# Patient Record
Sex: Male | Born: 1935 | Race: White | Hispanic: No | Marital: Married | Smoking: Current every day smoker
Health system: Southern US, Community
[De-identification: ages and names within clinical notes are randomized; demographics above are authoritative.]

## PROBLEM LIST (undated history)

## (undated) DIAGNOSIS — G629 Polyneuropathy, unspecified: Secondary | ICD-10-CM

## (undated) DIAGNOSIS — C61 Malignant neoplasm of prostate: Secondary | ICD-10-CM

## (undated) DIAGNOSIS — J449 Chronic obstructive pulmonary disease, unspecified: Secondary | ICD-10-CM

## (undated) DIAGNOSIS — F17200 Nicotine dependence, unspecified, uncomplicated: Secondary | ICD-10-CM

## (undated) DIAGNOSIS — T8859XA Other complications of anesthesia, initial encounter: Secondary | ICD-10-CM

## (undated) DIAGNOSIS — E785 Hyperlipidemia, unspecified: Secondary | ICD-10-CM

## (undated) DIAGNOSIS — M1990 Unspecified osteoarthritis, unspecified site: Secondary | ICD-10-CM

## (undated) DIAGNOSIS — I491 Atrial premature depolarization: Secondary | ICD-10-CM

## (undated) DIAGNOSIS — I493 Ventricular premature depolarization: Secondary | ICD-10-CM

## (undated) DIAGNOSIS — C229 Malignant neoplasm of liver, not specified as primary or secondary: Secondary | ICD-10-CM

## (undated) DIAGNOSIS — T4145XA Adverse effect of unspecified anesthetic, initial encounter: Secondary | ICD-10-CM

## (undated) DIAGNOSIS — I10 Essential (primary) hypertension: Secondary | ICD-10-CM

## (undated) HISTORY — PX: TOTAL SHOULDER REPLACEMENT: SUR1217

## (undated) HISTORY — DX: Unspecified osteoarthritis, unspecified site: M19.90

## (undated) HISTORY — PX: REPLACEMENT TOTAL KNEE: SUR1224

## (undated) HISTORY — DX: Ventricular premature depolarization: I49.3

## (undated) HISTORY — DX: Nicotine dependence, unspecified, uncomplicated: F17.200

## (undated) HISTORY — DX: Hyperlipidemia, unspecified: E78.5

## (undated) HISTORY — PX: CHOLECYSTECTOMY: SHX55

## (undated) HISTORY — DX: Atrial premature depolarization: I49.1

## (undated) HISTORY — DX: Polyneuropathy, unspecified: G62.9

## (undated) HISTORY — DX: Essential (primary) hypertension: I10

## (undated) HISTORY — DX: Malignant neoplasm of prostate: C61

---

## 2002-02-15 ENCOUNTER — Ambulatory Visit (HOSPITAL_COMMUNITY): Admission: RE | Admit: 2002-02-15 | Discharge: 2002-02-15 | Payer: Self-pay | Admitting: Gastroenterology

## 2002-02-15 ENCOUNTER — Encounter (INDEPENDENT_AMBULATORY_CARE_PROVIDER_SITE_OTHER): Payer: Self-pay | Admitting: *Deleted

## 2003-05-11 ENCOUNTER — Encounter: Admission: RE | Admit: 2003-05-11 | Discharge: 2003-05-11 | Payer: Self-pay | Admitting: Family Medicine

## 2005-07-11 ENCOUNTER — Ambulatory Visit: Payer: Self-pay | Admitting: Internal Medicine

## 2005-07-15 ENCOUNTER — Ambulatory Visit: Payer: Self-pay | Admitting: Internal Medicine

## 2005-07-26 ENCOUNTER — Ambulatory Visit (HOSPITAL_COMMUNITY): Admission: RE | Admit: 2005-07-26 | Discharge: 2005-07-26 | Payer: Self-pay | Admitting: Internal Medicine

## 2005-07-31 ENCOUNTER — Ambulatory Visit: Payer: Self-pay | Admitting: Internal Medicine

## 2005-08-22 ENCOUNTER — Ambulatory Visit: Payer: Self-pay | Admitting: Internal Medicine

## 2005-08-27 ENCOUNTER — Ambulatory Visit (HOSPITAL_COMMUNITY): Admission: RE | Admit: 2005-08-27 | Discharge: 2005-08-27 | Payer: Self-pay | Admitting: Gastroenterology

## 2005-08-27 ENCOUNTER — Encounter (INDEPENDENT_AMBULATORY_CARE_PROVIDER_SITE_OTHER): Payer: Self-pay | Admitting: Specialist

## 2005-09-20 ENCOUNTER — Encounter (INDEPENDENT_AMBULATORY_CARE_PROVIDER_SITE_OTHER): Payer: Self-pay | Admitting: Specialist

## 2005-09-20 ENCOUNTER — Ambulatory Visit (HOSPITAL_COMMUNITY): Admission: RE | Admit: 2005-09-20 | Discharge: 2005-09-20 | Payer: Self-pay | Admitting: General Surgery

## 2005-10-14 ENCOUNTER — Ambulatory Visit: Admission: RE | Admit: 2005-10-14 | Discharge: 2005-10-14 | Payer: Self-pay | Admitting: Orthopaedic Surgery

## 2005-10-15 ENCOUNTER — Emergency Department (HOSPITAL_COMMUNITY): Admission: EM | Admit: 2005-10-15 | Discharge: 2005-10-15 | Payer: Self-pay | Admitting: Emergency Medicine

## 2005-10-25 ENCOUNTER — Emergency Department (HOSPITAL_COMMUNITY): Admission: EM | Admit: 2005-10-25 | Discharge: 2005-10-25 | Payer: Self-pay | Admitting: *Deleted

## 2005-11-07 ENCOUNTER — Inpatient Hospital Stay (HOSPITAL_COMMUNITY): Admission: RE | Admit: 2005-11-07 | Discharge: 2005-11-09 | Payer: Self-pay | Admitting: Orthopaedic Surgery

## 2007-02-02 ENCOUNTER — Encounter: Admission: RE | Admit: 2007-02-02 | Discharge: 2007-02-02 | Payer: Self-pay | Admitting: Orthopaedic Surgery

## 2007-03-27 HISTORY — PX: CARDIOVASCULAR STRESS TEST: SHX262

## 2007-07-07 ENCOUNTER — Ambulatory Visit (HOSPITAL_COMMUNITY): Admission: RE | Admit: 2007-07-07 | Discharge: 2007-07-07 | Payer: Self-pay | Admitting: Orthopaedic Surgery

## 2007-11-30 ENCOUNTER — Encounter: Admission: RE | Admit: 2007-11-30 | Discharge: 2007-11-30 | Payer: Self-pay | Admitting: Orthopedic Surgery

## 2008-05-31 ENCOUNTER — Inpatient Hospital Stay (HOSPITAL_COMMUNITY): Admission: EM | Admit: 2008-05-31 | Discharge: 2008-06-03 | Payer: Self-pay | Admitting: Emergency Medicine

## 2008-05-31 ENCOUNTER — Encounter (INDEPENDENT_AMBULATORY_CARE_PROVIDER_SITE_OTHER): Payer: Self-pay | Admitting: Emergency Medicine

## 2008-05-31 ENCOUNTER — Ambulatory Visit: Payer: Self-pay | Admitting: Vascular Surgery

## 2008-06-01 ENCOUNTER — Encounter (INDEPENDENT_AMBULATORY_CARE_PROVIDER_SITE_OTHER): Payer: Self-pay | Admitting: Internal Medicine

## 2008-08-24 ENCOUNTER — Encounter: Admission: RE | Admit: 2008-08-24 | Discharge: 2008-08-24 | Payer: Self-pay | Admitting: Neurology

## 2008-12-23 ENCOUNTER — Ambulatory Visit: Admission: RE | Admit: 2008-12-23 | Discharge: 2009-02-24 | Payer: Self-pay | Admitting: Radiation Oncology

## 2009-02-26 ENCOUNTER — Ambulatory Visit: Admission: RE | Admit: 2009-02-26 | Discharge: 2009-04-19 | Payer: Self-pay | Admitting: Radiation Oncology

## 2009-10-23 ENCOUNTER — Ambulatory Visit: Payer: Self-pay | Admitting: Cardiology

## 2010-03-18 ENCOUNTER — Encounter: Payer: Self-pay | Admitting: Orthopaedic Surgery

## 2010-06-06 LAB — BASIC METABOLIC PANEL
BUN: 11 mg/dL (ref 6–23)
BUN: 14 mg/dL (ref 6–23)
CO2: 27 mEq/L (ref 19–32)
Chloride: 101 mEq/L (ref 96–112)
Chloride: 105 mEq/L (ref 96–112)
Glucose, Bld: 143 mg/dL — ABNORMAL HIGH (ref 70–99)
Potassium: 3.6 mEq/L (ref 3.5–5.1)
Potassium: 3.7 mEq/L (ref 3.5–5.1)
Sodium: 134 mEq/L — ABNORMAL LOW (ref 135–145)
Sodium: 138 mEq/L (ref 135–145)

## 2010-06-06 LAB — URINALYSIS, ROUTINE W REFLEX MICROSCOPIC
Glucose, UA: NEGATIVE mg/dL
Ketones, ur: NEGATIVE mg/dL
Nitrite: NEGATIVE
Protein, ur: NEGATIVE mg/dL
Urobilinogen, UA: 0.2 mg/dL (ref 0.0–1.0)

## 2010-06-06 LAB — CBC
HCT: 30.4 % — ABNORMAL LOW (ref 39.0–52.0)
HCT: 30.4 % — ABNORMAL LOW (ref 39.0–52.0)
HCT: 31.5 % — ABNORMAL LOW (ref 39.0–52.0)
Hemoglobin: 10.4 g/dL — ABNORMAL LOW (ref 13.0–17.0)
Hemoglobin: 10.7 g/dL — ABNORMAL LOW (ref 13.0–17.0)
MCV: 92.2 fL (ref 78.0–100.0)
MCV: 93.4 fL (ref 78.0–100.0)
Platelets: 299 10*3/uL (ref 150–400)
Platelets: 344 10*3/uL (ref 150–400)
Platelets: 376 10*3/uL (ref 150–400)
RBC: 3.28 MIL/uL — ABNORMAL LOW (ref 4.22–5.81)
RDW: 14.9 % (ref 11.5–15.5)
RDW: 15.2 % (ref 11.5–15.5)
WBC: 7.8 10*3/uL (ref 4.0–10.5)
WBC: 9.5 10*3/uL (ref 4.0–10.5)

## 2010-06-06 LAB — DIFFERENTIAL
Basophils Absolute: 0.1 10*3/uL (ref 0.0–0.1)
Basophils Relative: 1 % (ref 0–1)
Eosinophils Relative: 8 % — ABNORMAL HIGH (ref 0–5)
Monocytes Absolute: 0.9 10*3/uL (ref 0.1–1.0)
Monocytes Relative: 11 % (ref 3–12)
Neutrophils Relative %: 62 % (ref 43–77)

## 2010-06-06 LAB — IRON AND TIBC
Iron: 77 ug/dL (ref 42–135)
Saturation Ratios: 26 % (ref 20–55)
TIBC: 299 ug/dL (ref 215–435)
UIBC: 222 ug/dL

## 2010-06-06 LAB — VITAMIN B12: Vitamin B-12: 223 pg/mL (ref 211–911)

## 2010-06-06 LAB — CARDIAC PANEL(CRET KIN+CKTOT+MB+TROPI)
CK, MB: 2.7 ng/mL (ref 0.3–4.0)
Relative Index: 1.3 (ref 0.0–2.5)
Total CK: 170 U/L (ref 7–232)
Total CK: 179 U/L (ref 7–232)
Troponin I: 0.01 ng/mL (ref 0.00–0.06)
Troponin I: <0.01 ng/mL (ref 0.00–0.06)
Troponin I: <0.01 ng/mL (ref 0.00–0.06)

## 2010-06-06 LAB — TSH: TSH: 1.879 u[IU]/mL (ref 0.350–4.500)

## 2010-06-06 LAB — APTT: aPTT: 34 seconds (ref 24–37)

## 2010-06-06 LAB — GLUCOSE, CAPILLARY
Glucose-Capillary: 111 mg/dL — ABNORMAL HIGH (ref 70–99)
Glucose-Capillary: 115 mg/dL — ABNORMAL HIGH (ref 70–99)
Glucose-Capillary: 124 mg/dL — ABNORMAL HIGH (ref 70–99)

## 2010-06-06 LAB — POCT CARDIAC MARKERS
CKMB, poc: 2 ng/mL (ref 1.0–8.0)
Troponin i, poc: <0.05 ng/mL (ref 0.00–0.09)

## 2010-06-06 LAB — COMPREHENSIVE METABOLIC PANEL
AST: 36 U/L (ref 0–37)
Albumin: 3 g/dL — ABNORMAL LOW (ref 3.5–5.2)
Alkaline Phosphatase: 66 U/L (ref 39–117)
BUN: 14 mg/dL (ref 6–23)
CO2: 27 mEq/L (ref 19–32)
Chloride: 106 mEq/L (ref 96–112)
GFR calc Af Amer: >60 mL/min (ref >60)
Potassium: 4.3 mEq/L (ref 3.5–5.1)
Sodium: 139 mEq/L (ref 135–145)
Total Bilirubin: 0.9 mg/dL (ref 0.3–1.2)
Total Protein: 5.9 g/dL — ABNORMAL LOW (ref 6.0–8.3)

## 2010-07-10 NOTE — Consult Note (Signed)
NAME:  Lonnie, Barron NO.:  [id number]   MEDICAL RECORD NO.:  [id number]          PATIENT TYPE:  INP   LOCATION:  1430                         FACILITY:  [facility]   PHYSICIAN:  Ollen Gross, M.D.    DATE OF BIRTH:  October 12, 1928   DATE OF CONSULTATION:  DATE OF DISCHARGE:                                 CONSULTATION   REFERRING PHYSICIAN:  Isidor Holts, M.D., [facility] hospitalist.   REASON FOR CONSULTATION:  Evaluate left knee wound.   HISTORY OF PRESENT ILLNESS:  Lonnie Barron is a 77 year old male who  underwent a left total knee arthroplasty by Dr. Margie Billet at [facility] a week ago.  He had a stable postoperative course initially.  On  April 6, however when physical therapy came to visit him at home, it  was noted that he became dysarthric.  There was concerns about a stroke  and thus he was taken urgently to [facility] for evaluation.  He was admitted to the [facility].  He had been  evaluated for altered mental status.  Evaluation included a Doppler scan  of the lower extremity to rule out DVT which was negative.  He had a CT  angio which was negative.  MRI of his brain which showed no acute  strokes.  We were consulted to evaluate his knee wound.  The wound care  team saw him first on April 7, and did not see any signs of infection.  We are seeing him today for evaluation of the knee replacement.   PHYSICAL EXAMINATION:  A well-developed male in no apparent distress.  He has been afebrile with stable vital signs.  His most recent  temperature is 97.6 degrees Fahrenheit.  Evaluation of the left lower extremity shows moderate swelling, but  within normal limits for 1 week postoperatively from a knee replacement.  He does not have any calf tenderness.  Denna Haggard' sign is negative.  The  knee incision is well-healed with no erythema or exudate.  He has mild  swelling in the knee, but again normal for 1 week postop.  The thigh  is  also swollen, but within normal limits for a week postop.  There are no  signs of infection in the left lower extremity.   His most recent CBC shows a hemoglobin of 10.7 and a white count of 9.5,  thus no systemic signs of infection either.   ASSESSMENT:  Left total knee.  The left lower extremity has a normal  appearance for 1 week postoperative.  There are absolutely no signs of  infection.  I feel that it will be safe for him to resume physical  therapy for weightbearing as tolerated on left lower extremity and for  range of motion of the left knee whenever medicine feels that he is  medically stable to do so.  He can continue on a routine postoperative  course.      Ollen Gross, M.D.  Electronically Signed    FA/MEDQ  D:  06/02/2008  T:  06/02/2008  Job:  [id number]   cc:   Isidor Holts, M.D.

## 2010-07-10 NOTE — Procedures (Signed)
EEG NUMBER:  [id number]   REFERRING PHYSICIAN:  Isidor Holts, M.D.   CLINICAL HISTORY:  A 77 year old patient being evaluated for episodes of  syncope.   MEDICATION LISTED:  Heparin, Protonix, Flomax, Norvasc, Lotensin,  Neurontin, Zofran, Tylenol, Dilaudid, Klonopin, Haldol,  hydrochlorothiazide, Ativan, and oxycodone.   This is a 17-channel portable EEG recorded with the patient awake and  restless.  Standard 10/20 electrode placement was performed.   The background rhythm is probably 9-10 Hz alpha.  The patient is  referred for the tracing, there is excessive snoring artifact and  movement artifacts noted throughout.  The patient appears to be in light  sleep throughout the recording.  No definite paroxysmal epileptiform  features were noted.  Length of the recording is 20.9 minutes.  Technical component is average with excessive movement artifacts and  respiratory artifacts.  EKG tracing shows regular sinus rhythm.  Hyperventilation and photic stimulation were not performed.  This being  a portable study.   IMPRESSION:  This EEG performed during sleep states mainly is probably  within normal limits though suboptimal due to excessive respiratory and  movement artifacts.           ______________________________  Sunny Schlein. Pearlean Brownie, MD     ZOX:WRUE  D:  06/01/2008 19:54:34  T:  06/02/2008 05:01:43  Job #:  [id number]   cc:   Isidor Holts, M.D.

## 2010-07-10 NOTE — Op Note (Signed)
NAME:  Lonnie Barron, Lonnie Barron NO.:  [id number]   MEDICAL RECORD NO.:  [id number]          PATIENT TYPE:  AMB   LOCATION:  SDS                          FACILITY:  [facility]   PHYSICIAN:  Lubertha Basque. Dalldorf, M.D.DATE OF BIRTH:  February 17, 1929   DATE OF PROCEDURE:  07/07/2007  DATE OF DISCHARGE:  07/07/2007                               OPERATIVE REPORT   PREOPERATIVE DIAGNOSIS:  Right shoulder stiffness.   POSTOPERATIVE DIAGNOSIS:  Right shoulder stiffness.   PROCEDURES:  1. Right shoulder closed manipulation.  2. Right shoulder arthroscopic debridement.   ANESTHESIA:  General.   ATTENDING SURGEON:  Lubertha Basque. 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  He is  about 2 years from hemiarthroplasty of the shoulder.  He is also about a  year from arthroscopic debridement and manipulation done at [facility].  He  feels that afforded him transient relief.  It is felt that his best  procedure at this point will be conversion to total shoulder replacement  with the glenoid component but that is not anything that he is willing  to consider at this point.  He wished to undergo a repeat arthroscopy  and hopes that this helps him at least short term.  I reviewed the risk  of anesthesia, infection, and fracture related to manipulation and  repeat arthroscopy.  Informed operative consent was obtained.   SUMMARY OF FINDINGS AND PROCEDURE:  Under general anesthesia, a closed  manipulation was performed improving his forward flexion from about 80-  140 with audible pops on the way.  I did not push his external rotation  which was already 30 degrees and greater than his preoperative motion  prior to the shoulder replacement.  He was prepped and draped in normal  sterile fashion and then an arthroscopy was performed.  He had some scar  tissue which we debrided  and grade 4 change on portions of the glenoid  which was debrided.  The subscapularis appeared to be intact on the  anterior aspect of the shoulder and the rotator cuff also appeared  intact from below.   DESCRIPTION OF PROCEDURE:  The patient was taken to the operating suite  where general anesthetic was applied without difficulty.  He was  positioned in beach-chair position and prepped and draped in normal  sterile fashion.  After administration of IV Kefzol, an arthroscopy of  the right shoulder was performed.  This followed the manipulation where  we improved this forward flexion from 81-40 at least with audible pops.  In the shoulder and the glenohumeral joint,  he had some blood which we  evacuated consistent with his manipulation.  There were bands of scar  tissue which I excised.  Inspection of the glenoid revealed some  maintenance of this but really broad areas of grade 4 change at this  point.  The rotator cuff including the subscapularis appeared to be  intact.  The axillary recess was also addressed with removal of some  scar tissue.  The shoulder was thoroughly irrigated followed by  placement of Marcaine with epinephrine and morphine along with 80 mg of  Depo-Medrol.  Simple sutures of nylon were used to loosely reapproximate  the portals followed by Adaptic and dry gauze dressing with tape.  Estimated blood loss and fluids obtained from anesthesia records.   DISPOSITION:  The patient was extubated in the operating room and taken  to recovery room in stable addition.  He is to go home the same-day and  followup in the office next week.  I will contact him by phone tonight.      Lubertha Basque Jerl Santos, M.D.  Electronically Signed     PGD/MEDQ  D:  07/07/2007  T:  07/08/2007  Job:  [id number]

## 2010-07-10 NOTE — H&P (Signed)
NAME:  Lonnie Barron, Lonnie Barron NO.:  [id number]   MEDICAL RECORD NO.:  [id number]          PATIENT TYPE:  EMS   LOCATION:  ED                           FACILITY:  [facility]   PHYSICIAN:  Lucita Ferrara, MD         DATE OF BIRTH:  1928/08/13   DATE OF ADMISSION:  05/31/2008  DATE OF DISCHARGE:                              HISTORY & PHYSICAL   PRIMARY CARE PHYSICIAN:  [facility].   CHIEF COMPLAINT:  Alteration in mental status.   HISTORY OF PRESENT ILLNESS:  The patient is 77 year old status post  total left knee replacement at [facility] by Dr. Barbara Cower  on May 26, 2008 who presents with alteration in mental status.  Symptoms occurred at 2:25 p.m.;  currently it is 6:00 p.m.  The patient  symptoms included being witnessed to slump over with slurred speech for  a brief period of time,  nearly 4 hours after he had taken Dilaudid.  The family by bedside giving most of the history.  He has been up and  walking around, not sleeping except for short periods of time at night.  He took his pain medications; his wife by bedside showing bottles to me  7:30 a.m. this morning.  At 11:40 a.m.Marland Kitchen, he was walking across the  kitchen and had to sit down and was not acting right..  He had slurred  speech.  He had glassy eyes.  He was pale and sweating on his  scalp.  He denied any chest pain or shortness of breath. There was no  shaking that was noted.  There was no foaming of the mouth.  There was  no urinary or stool incontinence.  There is no fevers or chills.   Otherwise 12 point review of systems was negative.   PAST MEDICAL HISTORY IS:  Significant for:  1. Hypertension.  2. Diabetes type 2.  3. Nephrolithiasis.  4. Neuropathy.  5. Osteoarthritis.  6. Diabetes type 2.   FAMILY HISTORY:  Noncontributory.   ALLERGIES:  CODEINE AND CIPRO.   MEDICATIONS:  1. __________hydrochlorothiazide.  2. Aspirin.  3. Metformin.  4. Metoprolol.  5.  Amlodipine.  6. Amoxicillin.  7. Benazepril.  8. Chlorazepam.  9. Vitamin D.  10.Ferrous sulfate.  11.Gabapentin.  12.Senna.  13.Vitamin E.   NOTE:  Medications are not complete until medication reconciliation  sheet has been filled out.   REVIEW OF SYSTEMS:  As per HPI.  A 12 point otherwise  negative and  reviewed.   PAST SURGICAL HISTORY:  Status post cholecystectomy, status post knee  replacement.   SOCIAL HISTORY:  Patient denies drugs alcohol or tobacco.  Lives with  his spouse.   PHYSICAL EXAMINATION:  GENERAL:  The patient is in no acute distress.  Blood pressure is 128/70, pulse 87, respirations 16, temperature 97.5,  pulse oximetry 98% on room air.  HEENT: Normocephalic, atraumatic.  Sclerae is anicteric.  Neck:  Supple.  CARDIOVASCULAR:  S1 and S2.  Regular rate and rhythm.  No murmurs, rubs  or clicks.  LUNGS:  Clear to auscultation bilaterally without  rales or wheeze.  ABDOMEN:  Soft, nontender, nondistended, positive bowel sounds.  EXTREMITIES:  No clubbing, cyanosis or edema.  Swelling in the left knee  noted.  Dressing on his knee is clean, dry and intact.   EKG shows atrial bigeminy and there is no  prior EKG for comparison.  Cardiac enzymes negative.   Chest x-ray:  No active acute disease.  CT scan of the head:  Negative  for bleed or other intracranial abnormalities, atrophy and nonspecific  white matter changes.   CMP normal.  INR 1.0.  CBC normal.   ASSESSMENT:  The patient is 77-year with presyncopal episode:  1. Presyncopal episode,  alteration in mental status and changes in      cognition.  Rule out TIA versus CVA. Rule out seizure disorder.      Question if secondary to medication-induced.  2. Osteoarthritis status post knee replacement.  3. Hypertension.  4. Diabetes.  5. New bigeminy although no previous EKG to compare to.   DISCUSSION:  The patient will be admitted to medical telemetry unit.  Neurological checks q.4 h.  Will go ahead  and proceed with an MRI, MRA  of the brain and bilateral carotid Dopplers, stroke panel.  We will go  ahead and cycle his cardiac enzymes x3 q.8h.  Cardiology consultation as  needed.  Neurological consultation as needed.  Continue dressing changes  for his status post knee replacement.  The patient is not short of  breath and did not have  any chest pain to rule out any pulmonary reason however will go head and  get a D-dimer and if elevated would proceed with CT angiography.  The  patient is not hypoxic here thus clinical correlation may be useful.  DVT and GI prophylaxis with Lovenox and Protonix.  The rest of his plans  will depend on his progress.      Lucita Ferrara, MD  Electronically Signed     RR/MEDQ  D:  05/31/2008  T:  05/31/2008  Job:  [id number]

## 2010-07-10 NOTE — Discharge Summary (Signed)
NAME:  Lonnie Barron, Lonnie Barron NO.:  [id number]   MEDICAL RECORD NO.:  [id number]          PATIENT TYPE:  INP   LOCATION:  1430                         FACILITY:  [facility]   PHYSICIAN:  Isidor Holts, M.D.  DATE OF BIRTH:  [birth year]   DATE OF ADMISSION:  05/31/2008  DATE OF DISCHARGE:  06/03/2008                               DISCHARGE SUMMARY   PRIMARY MEDICAL DOCTOR:  Dr. Gilmore Laroche, [facility].   PRIMARY ORTHOPEDIC SURGEON:  Dr. Margie Billet, [facility].   DISCHARGE DIAGNOSES:  1. Altered mental status/transient dysarthria, secondary to probable      TIA.  2. Delirium secondary to opiates.  3. Hypertension.  4. Type 2 diabetes mellitus.  5. History of nephrolithiasis.  6. History of diabetic neuropathy.  7. Recent urinary tract infection.  8. Mild anemia.  9. Borderline B12 deficiency.  10.Osteoarthritis, status post left total knee replacement May 26, 2008.   DISCHARGE MEDICATIONS:  1. Flomax 0.4 mg p.o. daily.  2. Hydrochlorothiazide 25 mg p.o. daily.  3. Aspirin 325 mg p.o. daily.  4. Metformin 500 mg p.o. daily.  5. Amlodipine 10 mg p.o. daily.  6. Benazepril 40 mg p.o. daily.  7. Ferrous sulfate 325 mg p.o. daily.  8. Neurontin 300 mg p.o. t.i.d.  9. Senna one p.o. p.r.n. daily.  10.Clonazepam 0.5 mg p.o. p.r.n. b.i.d.  11.Ultram 25 mg p.o. p.r.n. q. 8 a.m.  12.Motrin 400 mg p.o. t.i.d. with food for 5 days only.  13.Vitamin B12 1000 mcg p.o. daily.   Note: Amoxicillin and Dilaudid have been discontinued.   PROCEDURES:  1. Head CT scan dated May 31, 2008.  This was negative for bleed or      other acute intracranial process.  There was atrophy and      nonspecific white matter changes.  2. Chest x-ray dated May 31, 2008.  This showed no acute disease.  3. X-ray left ankle data May 31, 2008.  This showed small calcific      density posterior to the distal tibia, possibly small avulsion  fragment.  4. Brain MRI dated June 01, 2008.  This was a limited examination.  No      acute stroke identified.  Chronic small vessel disease of the      hemispheric white matter.  5. CT angiogram dated June 01, 2008.  This showed no significant, but      small pulmonary embolus, sensitivity for the distal emboli was      limited due to suboptimal contrast bolus and significant patient      breathing motion.  No focal airspace disease or mass lesions, mild      atherosclerotic disease, postoperative changes of right shoulder      replacement.  6. EEG done June 01, 2008.  This was performed during sleep states,      and is probably within normal limits though suboptimal due to      excessive respiratory movement artifacts.  7. Bilateral lower extremity venous Doppler done on May 31, 2008.  This showed no obvious evidence of DVT, superficial thrombosis of      Baker's cyst, lymph nodes noted in the left groin, interstitial      fluid noted throughout.  8. Bilateral carotid/vertebral artery duplex scan dated June 01, 2008.      This showed left internal carotid artery severe plaque in the      vessel, vertebral artery flow antegrade bilaterally.  No      significant right ICA or left ICA stenosis noted.   CONSULTATIONS:  Dr. Ollen Gross, orthopedic surgeon.   ADMISSION HISTORY:  As H&P notes of May 31, 2008, dictated by Dr.  Flonnie Overman.  However, in brief, this is a 77 year old male, with known  history of hypertension, type 2 diabetes mellitus/neuropathy,  nephrolithiasis, osteoarthritis status post left total knee replacement  at [facility] May 26, 2008, presenting with altered mental status  described as slumping associated with slurred speech for a brief  period of time, maybe four hours after taking Dilaudid. Reportedly, at  about 11:40 a.m. after a rather sleepless night, while walking across  the kitchen suddenly had to sit down and was not acting right, had  glassy  eyes and slurred speech.  There were no observed involuntary  movements, frothing at the mouth, tongue-biting, stool or urinary  incontinence.  The patient was brought to the emergency department and  was subsequently admitted for further evaluation, investigation and  management.   CLINICAL COURSE.:  1. Altered mental status/transient dysarthria.  For details of      presentation, refer to admission history above.  Given the sudden      onset of symptomatology and the brevity of dysarthria, it was felt      that the patient may have suffered a TIA or less likely, a CVA.      Possible seizure disorder was considered.  The patient underwent      CVA workup including brain CT scan, MRI, carotid/vertebral artery      duplex scans.  For details of findings, refer to procedure list      above.  This showed no evidence of significant cerebrovascular      stenosis or acute infarct.  The patient was continued on low-dose      Aspirin.  EEG was unremarkable for seizure disorder.  The patient      had no further episodes of seizure, syncope or focal neurology      during the course of his hospitalization.  Bilateral lower      extremity venous Dopplers as well as chest CT angiogram done to      rule out venous thromboembolic disease, were negative.   1. Delirium.  This was likely contributory to altered mental status      above.  Following negative workup, including septic workup, i.e.      negative chest x-ray, negative urinalysis and blood cultures.  The      patient's medication was reviewed.  Dilaudid was discontinued.      Pain management was instituted with NSAIDS as well as low-dose      Ultram, and by June 02, 2008, the patient's mental status was back      to baseline, and has remained so ever since. Opioids may have been      the likely culprit.   1. Hypertension.  This was controlled with pre-admission      Hydrochlorothiazide, ACE inhibitor and calcium channel blocker.      The  patient was normotensive  throughout the course of his      hospitalization.   1. Type 2 diabetes mellitus.  This was controlled with a combination      of oral hypoglycemic, sliding-scale insulin coverage and      carbohydrate modified diet, and the patient remained euglycemic.   1. History of nephrolithiasis.  There were no problems referable to      this.   1. Neuropathy.  This remained controlled on pre-admission doses of      Gabapentin.   1. History of recent UTI.  The patient reportedly had been on a course      of Amoxicillin pre-admission, for possible UTI and had completed a      five day course of this treatment.  Urinalysis was negative for      urinary tract infection and urine cultures were negative.      Amoxicillin was therefore not continued.   1. Mild anemia.  The patient did have a mild anemia with hemoglobin of      10.4 as of June 03, 2008.  Iron studies showed the following      findings.  Iron 77, TIBC 299, percent desaturation 26, ferritin      150.  B12 was borderline low at 223.  The patient is already on      iron supplements.  It is likely that his mild anemia was secondary      to his recent surgery.  However, it was felt appropriate to      commence oral vitamin B12 supplements.  This has been instituted      accordingly.   1. Osteoarthritis, status post left total knee replacement May 26, 2008.  The patient underwent physiotherapy during the course of      this hospitalization.  His wound was reviewed by the wound care      team, and it was felt to be healing well.  Orthopedic consultation      was kindly provided by Dr. Ollen Gross.  For details of that      consultation, refer to consultation notes of June 02, 2008.  He has      opined that this patient's left lower extremity has an appearance      consitent with the current postoperative interval, and there was no      sign of infection.  He recommended continued physical therapy for       weightbearing as tolerated with range of motion of the left knee.      The patient underwent PT/OT during the course of this      hospitalization and was ambulating with a rolling walker and      assistance as of June 02, 2008.   DISPOSITION:  The patient was on June 03, 2008, considered clinically  stable for discharge.  Mental status was at baseline and there were no  new issues.  He was very keen to go, and was discharged accordingly.   DISCHARGE INSTRUCTIONS:  1. Diet heart-healthy/carbohydrate modified.  2. Activity per PT/OT.  3. Wound care, dry dressings only.   FOLLOW-UP INSTRUCTIONS:  The patient is recommended to follow up with  his primary MD, Dr. Gilmore Laroche, [facility], per prior  scheduled appointment.  He is also to follow up with Dr. Margie Billet his  primary orthopedic surgeon at [facility], per prior scheduled  appointment.   SPECIAL INSTRUCTIONS:  Continued PT/OT is recommended.  Isidor Holts, M.D.  Electronically Signed     CO/MEDQ  D:  06/03/2008  T:  06/03/2008  Job:  [id number]   cc:   Orthopedic Surgeon, [facility] Dr. Windell Moment [facility] Dr. Gilmore Laroche

## 2010-07-13 NOTE — Discharge Summary (Signed)
NAME:  Lonnie Barron, Lonnie Barron NO.:  [id number]   MEDICAL RECORD NO.:  [id number]          PATIENT TYPE:  INP   LOCATION:  5035                         FACILITY:  [facility]   PHYSICIAN:  Lubertha Basque. Dalldorf, M.D.DATE OF BIRTH:  October 03, 1928   DATE OF ADMISSION:  11/07/2005  DATE OF DISCHARGE:  11/09/2005                                 DISCHARGE SUMMARY   ADMITTING DIAGNOSES:  1. Right shoulder osteoarthritis.  2. Hypertension.  3. Diabetes mellitus.   DISCHARGE DIAGNOSES:  1. Right shoulder osteoarthritis.  2. Hypertension.  3. Diabetes mellitus.   OPERATIONS:  Right shoulder hemiarthroplasty.   BRIEF HISTORY:  Lonnie Barron, or Lonnie Barron, is a 77 year old white male patient  known to our practice with complaints of right shoulder pain.  On x-ray, we  have noted just recently that he has end-stage degenerative changes  exclusively to the humeral head.  We have tried corticosteroid and intra-  articular injections with minimal short-lived relief.  We discussed  treatment options with him that being shoulder hemiarthroplasty, the risks  of infection and anesthesia.   PERTINENT LABORATORY AND X-RAY FINDINGS:  WBCs 10.4, hemoglobin 14.6,  platelets 328, sodium 140, potassium 3.8, glucose 114, BUN 12, creatinine 1,  calcium 10.  CEA 2.4.   COURSE IN THE HOSPITAL:  He was admitted postoperatively and was kept on his  home medications which were Pro-Air, metoprolol, alprazolam, Lotrel,  metformin, HCTZ, vitamin E and promethazine.  IV Ancef 1 gm q.8h. x3 doses  was used.  Colace.  Percocet for pain p.o. and a morphine pump used IV for  more severe pain control.  Reglan for nausea and vomiting.  His home  medicine reconciliation sheet was signed.  Incentive spirometer q.1h. while  awake.  In-out catheter as needed.  He was in a sling postoperatively.  Therapy was written for just gentle passive motion.  Also, the glycemia  control orders set for non-pregnant adults was completed.   The first day  postop, he was using his PCA pump; he was out of bed.  Vital Signs were  Barron.  He had good neurovascular status to his right upper extremity with  no sign of infection.  His PCA was discontinued.  The second day postop, the  dressing was changed, his wound was benign, there was no sign of infection.  He had good neurovascular  status in this right upper extremity.  He was  voiding without difficulty.  Diet was regular.  He was discharged home.   CONDITION ON DISCHARGE:  Improved.   He was given a prescription for Percocet for pain.  His medicine  reconciliation sheet was signed and his medicines were continued as  mentioned above.  He was also given a prescription for Ultram and Robaxin.  He would be able to change his dressing on postoperative day #5 with gauze  and tape and continue active range of motion per the physical therapy.  Diet  was unrestricted.  Any sign of infection, he was to call our office at [phone].  He will follow up there within a week to 10 days.  He was also kept on Pro-Air HFA 2 puffs every 4 hours, metoprolol ER 25 mg  one a day, alprazolam 0.5 q.6h. p.r.n. anxiety, Lotrel 5/20 one a day,  metformin  HCl 500 mg two in the a.m. and one in the p.m., HCTZ 25 mg one  daily, vitamin E 400 units one a day  and promethazine as needed for nausea  25 mg.      [name], P.A.      Lubertha Basque Jerl Santos, M.D.  Electronically Signed    MC/MEDQ  D:  11/26/2005  T:  11/27/2005  Job:  [id number]

## 2010-07-13 NOTE — Op Note (Signed)
NAME:  Lonnie, Barron NO.:  [id number]   MEDICAL RECORD NO.:  [id number]          PATIENT TYPE:  INP   LOCATION:  5035                         FACILITY:  [facility]   PHYSICIAN:  Lubertha Basque. Dalldorf, M.D.DATE OF BIRTH:  [birth year]   DATE OF PROCEDURE:  11/07/2005  DATE OF DISCHARGE:                                 OPERATIVE REPORT   PREOPERATIVE DIAGNOSIS:  Right shoulder degenerative arthritis.   POSTOPERATIVE DIAGNOSIS:  Right shoulder degenerative arthritis.   PROCEDURE:  Right shoulder hemiarthroplasty.   ANESTHESIA:  General and block.   ATTENDING SURGEON:  Lubertha Basque. Jerl Santos, M.D.   ASSISTANT:  [name], P.A.   INDICATIONS FOR PROCEDURE:  The patient is a 77 year old man with a long  history of a degenerative right shoulder.  He has failed various oral anti-  inflammatories and only achieved a few days relief with an intra-articular  injection.  He has end-stage degeneration but fairly good maintenance of the  glenoid.  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  He also has pain which inhibits his ability to rest  at night.  He is offered a hemiarthroplasty of the right shoulder.  Informed  operative consent was obtained discussion of possible complications of  reaction to anesthesia, infection, neurovascular injury, and stiffness.   SUMMARY OF FINDINGS AND PROCEDURE:  Under general anesthesia and a block  through an extended deltopectoral approach a right shoulder hemiarthroplasty  was performed.  He did have severe degeneration and deformation of the  humeral head but the glenoid appeared fairly symmetric.  We addressed this  problem with an uncemented size 14 Global stem by DePuy capped with a  eccentric size 56 cobalt chrome head.  Bryna Colander assisted throughout and  was invaluable to the completion of the case in that he retracted while I  could perform the procedure.  He also closed simultaneously  to help minimize  OR time.   DESCRIPTION OF PROCEDURE:  The patient was taken to operating suite where  general anesthetic was applied without difficulty.  He was also given a  block in the preanesthesia area.  He was positioned in beach-chair position  and prepped and draped in normal sterile fashion.  After the administration  of preop IV Kefzol, an extended deltopectoral approach was taken to the  right shoulder.  An incision was made from the inferior aspect of the  clavicle just above the coracoid process and was extended down towards the  deltoid tuberosity.  Dissection was carried down to the deltopectoral  interval and the cephalic vein was never really visualized.  This  deltopectoral interval was exploited to expose the clavipectoral fascia  underneath.  This structure was incised.  The conjoined tendon was taken in  a medial direction.  Appropriate self-retaining retractors were placed  followed by release of the subscapularis directly off bone at the lesser  tuberosity.  This was tagged with #1 Ethibond for later repair.  An inferior  capsular release was performed after localization of the axillary nerve.  A  humeral head cut was made  with about 30 degrees of retroversion at the  appropriate tilt.  Care was taken to preserve the insertion of the rotator  cuff.  The humerus was then reamed and broached up to a size 16.  The  glenoid was felt to be well-preserved and symmetric.  A trial reduction was  done with various head and neck assemblies and the eccentric 56 seemed to  give [place] the best coverage and best stability.  The trial components were  removed followed by thorough irrigation of the joint.  This was followed by  placement of a size 14 Global DePuy stem in about 30 degrees of  retroversion.  This was capped with the eccentric 56 cobalt chrome head.  Again the shoulder was reduced and things seemed stable.  I then repaired  the subscapularis to the edge of our neck cut  with the aforementioned #1  Ethibond sutures directly to bone through tunnels made with a drill and  towel clip.  I then repaired the rotator interval with Vicryl.  The shoulder  was again irrigated followed by removal of the self-retaining retractors.  The deltopectoral interval was then repaired with Vicryl loosely followed by  subcutaneous reapproximation with 2-0 undyed Vicryl and skin closure with  staples.  Adaptic was placed over the wound followed by dry gauze and tape.  Estimated blood loss and fluids can obtained from anesthesia records.   DISPOSITION:  The patient was extubated in the operating room and taken to  recovery in stable addition.  He was to be admitted to the orthopedic  surgery service for appropriate postop care to include perioperative  antibiotics and PT for gentle range of motion.      Lubertha Basque Jerl Santos, M.D.  Electronically Signed     PGD/MEDQ  D:  11/07/2005  T:  11/08/2005  Job:  [id number]

## 2010-07-13 NOTE — Op Note (Signed)
NAME:  Lonnie Barron, Lonnie Barron NO.:  [id number]   MEDICAL RECORD NO.:  [id number]          PATIENT TYPE:  AMB   LOCATION:  DAY                          FACILITY:  [facility]   PHYSICIAN:  Timothy E. Earlene Plater, M.D. DATE OF BIRTH:  [birth year]   DATE OF PROCEDURE:  09/20/2005  DATE OF DISCHARGE:                                 OPERATIVE REPORT   PREOPERATIVE DIAGNOSIS:  Cholecystolithiasis.   POSTOPERATIVE DIAGNOSIS:  Cholecystolithiasis.   PROCEDURE:  1.  Laparoscopic cholecystectomy.  2.  Operative cholangiogram.   SURGEON:  Timothy E. Earlene Plater, M.D.   ASSISTANT:  Anselm Pancoast. Zachery Dakins, M.D.   ANESTHESIA:  General.   Lonnie Barron has recently had an extensive workup by Dr. Yancey Flemings revealing  only small gallstones.  His clinical syndrome is a little different than  usual but compatible with cholecystitis.  A cyst of the pancreas had been  evaluated and found to be benign.  The patient does have hypertension and  diabetes, all seem to be under good control.  So, he is ready to proceed.  He was seen in preop, identified and the permit signed, antibiotics and PASO  was applied.   He was taken to the operating room, placed supine, general endotracheal  anesthesia administered.  The abdomen was prepped and draped in the usual  fashion.  A vertical subumbilical incision was made, fascia identified,  peritoneum entered without complication, a 0 suture applied and the trocar  applied through the incision, tied in place and the abdominal insufflated.  General peritoneoscopy was unremarkable, all we could see was omental fat.  A second 10 mm trocar placed in the mid epigastrium, two 5 mm trocars in the  right upper quadrant.  With the patient in the head up, we could see the top  of the gallbladder.  This was grasped.  The omental adhesions were taken  down bluntly and the gallbladder was exposed in its entirety, being  perfectly vertical.  A normal appearing cystic duct was  dissected out  completely, behind that a cystic artery was seen and no other structures.  A  clip was placed near the gallbladder side of the cystic duct, the cystic  duct opened and a percutaneously placed catheter was inserted in place.  Using half strength dye and real time fluoroscopy, a cholangiogram was  carried out and was thought to be perfectly normal.  The clip and catheter  were removed, the cystic duct triply clipped and divided, behind that the  cystic artery dissected out, triply clipped and divided, the gallbladder  removed from the gallbladder bed without complications, spillage or  problems.  It was put in an EndoCatch.  Copious irrigation was carried out,  cautery where necessary, the wound was dry.  The gallbladder was then  removed through the infraumbilical incision along with a trocar, that was  tied closed and was tight.  Further inspection and irrigation carried out,  then all irrigant, CO2, instruments and  trocars were removed under direct vision.  He tolerated well.  Skin and skin  incisions closed with Monocryl and Steri-Strips.  Final counts correct,  he  tolerated it well.  The patient will be observed and sent home today if  possible, or over night if necessary.      Timothy E. Earlene Plater, M.D.  Electronically Signed     TED/MEDQ  D:  09/20/2005  T:  09/20/2005  Job:  [id number]   cc:   [facility]  [address] [place] [address]   Peter M. [place], M.D.  Fax: [phone]   Wilhemina Bonito. Marina Goodell, M.D. [facility]  [address]  [place]  [place] [address]

## 2010-07-13 NOTE — Op Note (Signed)
   NAME:  Lonnie Barron, Lonnie Barron NO.:  [id number]   MEDICAL RECORD NO.:  [id number]                   PATIENT TYPE:  AMB   LOCATION:  ENDO                                 FACILITY:  [facility]   PHYSICIAN:  James L. Malon Kindle., M.D.          DATE OF BIRTH:  [birth year]   DATE OF PROCEDURE:  02/15/2002  DATE OF DISCHARGE:                                 OPERATIVE REPORT   PROCEDURE:  Colonoscopy and biopsy.   ANESTHESIA:  Fentanyl 75 mcg, Versed 6 mg IV.   SCOPE:  Olympus pediatric colonoscope.   INDICATIONS FOR PROCEDURE:  Colon cancer screening.   DESCRIPTION OF PROCEDURE:  With the patient in the left lateral decubitus  position, the Olympus scope was inserted and advanced under direct  visualization. The pediatric adjustable colonoscope was used. We were able  to reach the cecum without difficulty. The ileocecal valve and appendiceal  orifice were seen. The scope was withdrawn. In the  ascending colon was a  large approximately 2 cm soft polypoid area. This had the appearance of a  lipoma. This covered what appeared to be fairly normal mucosa. Several  biopsies were taken to make sure that this was not an adenoma but it had  endoscopic appearance of a lipoma. The scope was withdrawn. No other polyps  were seen throughout the transverse, descending or sigmoid colon. The rectum  was also free of polyps. The scope was withdrawn.  The patient tolerated the  procedure well.   ASSESSMENT:  Ascending colon polyps biopsied, probably lipoma.   PLAN:  Will check path and make further recommendations depending on the  results.                                                James L. Malon Kindle., M.D.    Waldron Session  D:  02/15/2002  T:  02/15/2002  Job:  [id number]   cc:   [facility]

## 2010-10-15 ENCOUNTER — Encounter: Payer: Self-pay | Admitting: Cardiology

## 2010-10-31 ENCOUNTER — Encounter: Payer: Self-pay | Admitting: Cardiology

## 2010-10-31 ENCOUNTER — Ambulatory Visit (INDEPENDENT_AMBULATORY_CARE_PROVIDER_SITE_OTHER): Payer: Medicare Other | Admitting: Cardiology

## 2010-10-31 VITALS — BP 140/80 | HR 67 | Ht 74.0 in | Wt 231.8 lb

## 2010-10-31 DIAGNOSIS — E785 Hyperlipidemia, unspecified: Secondary | ICD-10-CM | POA: Insufficient documentation

## 2010-10-31 DIAGNOSIS — I493 Ventricular premature depolarization: Secondary | ICD-10-CM | POA: Insufficient documentation

## 2010-10-31 DIAGNOSIS — F17200 Nicotine dependence, unspecified, uncomplicated: Secondary | ICD-10-CM | POA: Insufficient documentation

## 2010-10-31 DIAGNOSIS — I10 Essential (primary) hypertension: Secondary | ICD-10-CM | POA: Insufficient documentation

## 2010-10-31 DIAGNOSIS — I491 Atrial premature depolarization: Secondary | ICD-10-CM

## 2010-10-31 DIAGNOSIS — I4949 Other premature depolarization: Secondary | ICD-10-CM

## 2010-10-31 NOTE — Assessment & Plan Note (Signed)
We discussed the benefits of smoking cessation. He is not interested in quitting.

## 2010-10-31 NOTE — Assessment & Plan Note (Signed)
Blood pressure is well controlled on his current regimen 

## 2010-10-31 NOTE — Assessment & Plan Note (Signed)
He has no significant palpitations. He had a normal nuclear stress test in 2009.

## 2010-10-31 NOTE — Assessment & Plan Note (Signed)
His chemistries are followed by Dr. Toni Arthurs. He'll continue on his current medical therapy.

## 2010-10-31 NOTE — Patient Instructions (Signed)
Continue your current therapy.  Its never too late to quit smoking.  I will see you again in 1 year.

## 2010-10-31 NOTE — Progress Notes (Signed)
Lonnie Barron Date of Birth: 21-Feb-1929   History of Present Illness: Lonnie Barron is seen today for yearly followup. He reports that he is feeling well. He continues to smoke 1-1/2 packs per day but has no interest in quitting. He denies any chest pain, short of breath, or palpitations. He complains of neuropathic pain in his right hand. He has had previous carpal tunnel release. He also has severe arthritis in his left shoulder and he thinks it may need to be replaced.   Current Outpatient Prescriptions on File Prior to Visit  Medication Sig Dispense Refill  . amLODipine (NORVASC) 10 MG tablet Take 10 mg by mouth daily.        Marland Kitchen aspirin 81 MG tablet Take 81 mg by mouth daily.        Marland Kitchen b complex vitamins tablet Take 1 tablet by mouth daily.        . benazepril (LOTENSIN) 40 MG tablet Take 40 mg by mouth daily.        . Cholecalciferol (VITAMIN D PO) Take by mouth daily.        . clonazePAM (KLONOPIN) 0.5 MG tablet Take 0.5 mg by mouth 2 (two) times daily as needed.        . DiphenhydrAMINE HCl (BENADRYL ALLERGY PO) Take by mouth.        . fish oil-omega-3 fatty acids 1000 MG capsule Take 1 g by mouth daily.        Marland Kitchen gabapentin (NEURONTIN) 300 MG capsule Take 600 mg by mouth 3 (three) times daily.       . hydrochlorothiazide 25 MG tablet Take 25 mg by mouth daily.        Marland Kitchen losartan (COZAAR) 100 MG tablet Take 1 tablet by mouth At bedtime.      . meloxicam (MOBIC) 15 MG tablet Take 1 tablet by mouth At bedtime.      . metFORMIN (GLUCOPHAGE) 500 MG tablet Take 500 mg by mouth 2 (two) times daily with a meal.        . metoprolol succinate (TOPROL-XL) 25 MG 24 hr tablet Take 25 mg by mouth daily.        . simvastatin (ZOCOR) 20 MG tablet Take 20 mg by mouth at bedtime.        . vitamin E 400 UNIT capsule Take 400 Units by mouth daily.        . rosuvastatin (CRESTOR) 5 MG tablet Take 5 mg by mouth daily.          Allergies  Allergen Reactions  . Ciprofloxacin   . Codeine     Past  Medical History  Diagnosis Date  . Hypertension   . PAC (premature atrial contraction)   . PVC's (premature ventricular contractions)   . Tobacco dependence   . Diabetes mellitus   . Hyperlipidemia   . Prostate cancer   . Osteoarthritis   . Peripheral neuropathy     Past Surgical History  Procedure Date  . Replacement total knee   . Total shoulder replacement   . Cholecystectomy   . Cardiovascular stress test 03/27/2007    EF 51%    History  Smoking status  . Current Everyday Smoker -- 1.5 packs/day  Smokeless tobacco  . Not on file    History  Alcohol Use No    Family History  Problem Relation Age of Onset  . COPD Mother   . Stroke Father     Review of Systems: The review  of systems is positive for peripheral neuropathy with loss of sensation in his lower extremities above the knees.  All other systems were reviewed and are negative.  Physical Exam: BP 140/80  Pulse 67  Ht 6\' 2"  (1.88 m)  Wt 231 lb 12.8 oz (105.144 kg)  BMI 29.76 kg/m2 He is a pleasant elderly white male in no acute distress. His HEENT exam is unremarkable. He has no JVD or bruits. There is no thyromegaly or adenopathy. Lungs are clear. Cardiac exam reveals a regular rate and rhythm without gallop, murmur, or click. Abdomen is soft and nontender. He has no edema. Pedal pulses are palpable. He does have diminished sensation in his lower extremities in a stocking distribution. Neurologic exam is otherwise nonfocal. LABORATORY DATA: ECG today is normal.  Assessment / Plan:

## 2011-03-15 ENCOUNTER — Emergency Department (INDEPENDENT_AMBULATORY_CARE_PROVIDER_SITE_OTHER)
Admission: EM | Admit: 2011-03-15 | Discharge: 2011-03-15 | Disposition: A | Discharge status: Home / Self Care | Payer: Medicare Other | Source: Home / Self Care | Attending: Family Medicine | Admitting: Family Medicine

## 2011-03-15 ENCOUNTER — Emergency Department (INDEPENDENT_AMBULATORY_CARE_PROVIDER_SITE_OTHER): Payer: Medicare Other

## 2011-03-15 ENCOUNTER — Other Ambulatory Visit: Payer: Self-pay

## 2011-03-15 ENCOUNTER — Encounter (HOSPITAL_COMMUNITY): Payer: Self-pay | Admitting: Emergency Medicine

## 2011-03-15 DIAGNOSIS — J40 Bronchitis, not specified as acute or chronic: Secondary | ICD-10-CM

## 2011-03-15 MED ORDER — ALBUTEROL SULFATE (5 MG/ML) 0.5% IN NEBU
5.0000 mg | INHALATION_SOLUTION | Freq: Once | RESPIRATORY_TRACT | Status: DC
  Administered 2011-03-15: 5 mg via RESPIRATORY_TRACT

## 2011-03-15 MED ORDER — AZITHROMYCIN 250 MG PO TABS: 250.0000 mg | ORAL_TABLET | Freq: Every day | ORAL | Status: AC

## 2011-03-15 MED ORDER — IPRATROPIUM BROMIDE 0.02 % IN SOLN
0.5000 mg | RESPIRATORY_TRACT | Status: DC
  Administered 2011-03-15: 0.5 mg via RESPIRATORY_TRACT

## 2011-03-15 MED ORDER — ALBUTEROL SULFATE (5 MG/ML) 0.5% IN NEBU
INHALATION_SOLUTION | RESPIRATORY_TRACT | Status: AC
  Filled 2011-03-15: qty 1

## 2011-03-15 MED ORDER — ALBUTEROL SULFATE HFA 108 (90 BASE) MCG/ACT IN AERS: 2.0000 | INHALATION_SPRAY | RESPIRATORY_TRACT | Status: DC | PRN

## 2011-03-15 MED ORDER — ALBUTEROL SULFATE (5 MG/ML) 0.5% IN NEBU: 5.0000 mg | INHALATION_SOLUTION | Freq: Once | RESPIRATORY_TRACT | Status: DC

## 2011-03-15 MED ORDER — IPRATROPIUM BROMIDE 0.02 % IN SOLN: 0.5000 mg | Freq: Once | RESPIRATORY_TRACT | Status: DC

## 2011-03-15 NOTE — ED Provider Notes (Signed)
History     CSN: [id number]  Arrival date & time 03/15/11  1758   First MD Initiated Contact with Patient 03/15/11 1757      Chief Complaint  Patient presents with  . Fall  . Dizziness  . Cough    (Consider location/radiation/quality/duration/timing/severity/associated sxs/prior treatment) HPI Comments: Lonnie Barron presents for evaluation after sustaining a fall this afternoon, around 3 pm, getting out of bed. He states that his foot was caught in the sheet, and he fell onto the floor striking his face. He denies confusion or dizziness. He reports onset of fever and chills on Wednesday, with worsening cough since that time. He reports feeling "wobbly" since then. He has not had much to eat today. He reports chest pain with coughing. He continues to smoke cigarettes but denies any dx of COPD. His daughter gave him two puffs from an old albuterol inhaler.    EKG: normal sinus rhythm, rate 98 CXR: bronchitic changes  Patient is a 78 y.o. male presenting with fall and cough. The history is provided by the patient, the spouse and a relative.  Fall The accident occurred 3 to 5 hours ago. The fall occurred from a bed. He fell from an unknown height. He landed on carpet. The volume of blood lost was minimal. The point of impact was the head (face). The pain is present in the head. The pain is mild. He was ambulatory at the scene. Associated symptoms include a fever. Pertinent negatives include no loss of consciousness.  Cough This is a new problem. The current episode started more than 2 days ago. The problem occurs constantly. The problem has not changed since onset.The cough is non-productive. The maximum temperature recorded prior to his arrival was 101 to 101.9 F. Associated symptoms include chest pain, chills and wheezing. Treatments tried: albuterol. He is a smoker. His past medical history is significant for bronchitis. His past medical history does not include COPD or asthma.    Past  Medical History  Diagnosis Date  . Hypertension   . PAC (premature atrial contraction)   . PVC's (premature ventricular contractions)   . Tobacco dependence   . Diabetes mellitus   . Hyperlipidemia   . Prostate cancer   . Osteoarthritis   . Peripheral neuropathy   . Complication of anesthesia     ?, became limp after surgery -was hospitalized  . COPD (chronic obstructive pulmonary disease)     Past Surgical History  Procedure Date  . Replacement total knee   . Total shoulder replacement   . Cholecystectomy   . Cardiovascular stress test 03/27/2007    EF 51%    Family History  Problem Relation Age of Onset  . COPD Mother   . Stroke Father     History  Substance Use Topics  . Smoking status: Current Everyday Smoker -- 1.5 packs/day for 65 years  . Smokeless tobacco: Never Used  . Alcohol Use: No      Review of Systems  Constitutional: Positive for fever and chills.  HENT: Negative.   Eyes: Negative.   Respiratory: Positive for cough and wheezing.   Cardiovascular: Positive for chest pain.  Gastrointestinal: Negative.   Genitourinary: Negative.   Musculoskeletal: Negative.   Neurological: Positive for dizziness and light-headedness. Negative for loss of consciousness.  Psychiatric/Behavioral: Negative.     Allergies  Ciprofloxacin; Tramadol; and Codeine  Home Medications   Current Outpatient Rx  Name Route Sig Dispense Refill  . ALBUTEROL SULFATE HFA 108 (90 BASE) MCG/ACT  IN AERS Inhalation Inhale 2 puffs into the lungs every 4 (four) hours as needed for wheezing or shortness of breath. 1 Inhaler 0  . AMLODIPINE BESYLATE 10 MG PO TABS Oral Take 10 mg by mouth daily.      . ASPIRIN EC 81 MG PO TBEC Oral Take 81 mg by mouth daily.    . B COMPLEX PO TABS Oral Take 1 tablet by mouth daily.      . BUDESONIDE-FORMOTEROL FUMARATE 160-4.5 MCG/ACT IN AERO Inhalation Inhale 2 puffs into the lungs 2 (two) times daily. 1 Inhaler 12  . VITAMIN D 1000 UNITS PO TABS Oral  Take 2,000 Units by mouth 3 (three) times daily.    Marland Kitchen CLONAZEPAM 0.5 MG PO TABS Oral Take 0.5 mg by mouth 3 (three) times daily as needed. For anxiety    . OMEGA-3 FATTY ACIDS 1000 MG PO CAPS Oral Take 1 g by mouth daily.      Marland Kitchen GABAPENTIN 600 MG PO TABS Oral Take 600 mg by mouth 4 (four) times daily.    . GUAIFENESIN ER 600 MG PO TB12 Oral Take 1,200 mg by mouth 2 (two) times daily.    Marland Kitchen LOSARTAN POTASSIUM 100 MG PO TABS Oral Take 1 tablet by mouth At bedtime.    Marland Kitchen METFORMIN HCL 500 MG PO TABS Oral Take 1,000 mg by mouth 2 (two) times daily with a meal.    . METOPROLOL SUCCINATE ER 50 MG PO TB24 Oral Take 50 mg by mouth daily. Take with or immediately following a meal.    . OVER THE COUNTER MEDICATION Oral Take 2 tablets by mouth 2 (two) times daily. Formula 303. Muscle relaxer.    Marland Kitchen PREDNISONE (PAK) 10 MG PO TABS  Take 4 tablets daily for 3days, then 3 tablets daily for 3 days, then 2 tablets daily for 3 days, then 1 tablet daily for 3 days, then stop. Take with food 30 tablet 0  . SIMVASTATIN 20 MG PO TABS Oral Take 20 mg by mouth at bedtime.      Marland Kitchen TIOTROPIUM BROMIDE MONOHYDRATE 18 MCG IN CAPS Inhalation Place 1 capsule (18 mcg total) into inhaler and inhale daily. 30 capsule 0  . VITAMIN E 400 UNITS PO CAPS Oral Take 400 Units by mouth daily.        BP 130/75  Pulse 98  Temp(Src) 101.7 F (38.7 C) (Oral)  Resp 24  SpO2 93%  Physical Exam  Nursing note and vitals reviewed. Constitutional: He is oriented to person, place, and time. He appears well-developed and well-nourished.  HENT:  Head: Normocephalic. Head is with abrasion.    Eyes: EOM are normal.  Neck: Normal range of motion.  Cardiovascular: Normal rate and regular rhythm.   No murmur heard. Pulmonary/Chest: Effort normal. He has wheezes in the right upper field, the right middle field, the right lower field, the left upper field, the left middle field and the left lower field.  Musculoskeletal: Normal range of motion.    Neurological: He is alert and oriented to person, place, and time.  Skin: Skin is warm and dry.  Psychiatric: His behavior is normal.    ED Course  Procedures (including critical care time)  Labs Reviewed - No data to display No results found.   1. Bronchitis       MDM  ECG normal; CXR reviewed by radiologist and myself, bronchitic changes; given albuterol        Richardo Priest, MD 04/11/11 [phone]

## 2011-03-15 NOTE — ED Notes (Signed)
ALBUTEROL/STROVENT TREATMENT GIVEN ONCE.D/C'D REPEAT ORDER

## 2011-03-15 NOTE — ED Notes (Signed)
Pt brought in urgent from waiting room for c/o disorientation due to fall this am while getting oob and dizziness.pt alert,oriented but unsure of date.pt fell straight on face with abrasion and min bleeding to nose and left forehead.pt also c/o cold sx started 3 night ago.increased moist,nonprod cough,sob and weakness reported.sats 92% r/a.pt still a smoker according to wife.3 liters [place] applied.pt having sob with scatt rhonchi and wheezing.pleurisy noted with coughing ,sore ribs.will cont to monitor

## 2011-03-18 ENCOUNTER — Inpatient Hospital Stay (HOSPITAL_COMMUNITY)
Admission: EM | Admit: 2011-03-18 | Discharge: 2011-03-21 | DRG: 194 | Disposition: A | Discharge status: Home / Self Care | Payer: Medicare Other | Attending: Internal Medicine | Admitting: Internal Medicine

## 2011-03-18 ENCOUNTER — Other Ambulatory Visit: Payer: Self-pay

## 2011-03-18 ENCOUNTER — Emergency Department (HOSPITAL_COMMUNITY): Payer: Medicare Other

## 2011-03-18 ENCOUNTER — Encounter (HOSPITAL_COMMUNITY): Payer: Self-pay | Admitting: Emergency Medicine

## 2011-03-18 DIAGNOSIS — Z96659 Presence of unspecified artificial knee joint: Secondary | ICD-10-CM

## 2011-03-18 DIAGNOSIS — F17200 Nicotine dependence, unspecified, uncomplicated: Secondary | ICD-10-CM

## 2011-03-18 DIAGNOSIS — E785 Hyperlipidemia, unspecified: Secondary | ICD-10-CM

## 2011-03-18 DIAGNOSIS — I493 Ventricular premature depolarization: Secondary | ICD-10-CM

## 2011-03-18 DIAGNOSIS — I10 Essential (primary) hypertension: Secondary | ICD-10-CM | POA: Diagnosis present

## 2011-03-18 DIAGNOSIS — R0682 Tachypnea, not elsewhere classified: Secondary | ICD-10-CM

## 2011-03-18 DIAGNOSIS — R531 Weakness: Secondary | ICD-10-CM | POA: Diagnosis present

## 2011-03-18 DIAGNOSIS — R5383 Other fatigue: Secondary | ICD-10-CM | POA: Diagnosis present

## 2011-03-18 DIAGNOSIS — J09X2 Influenza due to identified novel influenza A virus with other respiratory manifestations: Principal | ICD-10-CM | POA: Diagnosis present

## 2011-03-18 DIAGNOSIS — R5381 Other malaise: Secondary | ICD-10-CM | POA: Diagnosis present

## 2011-03-18 DIAGNOSIS — E119 Type 2 diabetes mellitus without complications: Secondary | ICD-10-CM | POA: Diagnosis present

## 2011-03-18 DIAGNOSIS — J209 Acute bronchitis, unspecified: Secondary | ICD-10-CM | POA: Diagnosis present

## 2011-03-18 DIAGNOSIS — R269 Unspecified abnormalities of gait and mobility: Secondary | ICD-10-CM | POA: Diagnosis present

## 2011-03-18 DIAGNOSIS — I491 Atrial premature depolarization: Secondary | ICD-10-CM

## 2011-03-18 DIAGNOSIS — J441 Chronic obstructive pulmonary disease with (acute) exacerbation: Secondary | ICD-10-CM | POA: Diagnosis present

## 2011-03-18 DIAGNOSIS — J440 Chronic obstructive pulmonary disease with acute lower respiratory infection: Secondary | ICD-10-CM | POA: Diagnosis present

## 2011-03-18 HISTORY — DX: Chronic obstructive pulmonary disease, unspecified: J44.9

## 2011-03-18 HISTORY — DX: Other complications of anesthesia, initial encounter: T88.59XA

## 2011-03-18 HISTORY — DX: Adverse effect of unspecified anesthetic, initial encounter: T41.45XA

## 2011-03-18 LAB — LACTIC ACID, PLASMA: Lactic Acid, Venous: 2.5 mmol/L — ABNORMAL HIGH (ref 0.5–2.2)

## 2011-03-18 LAB — BASIC METABOLIC PANEL
CO2: 25 mEq/L (ref 19–32)
Calcium: 9.9 mg/dL (ref 8.4–10.5)
Creatinine, Ser: 1.35 mg/dL (ref 0.50–1.35)
GFR calc Af Amer: 56 mL/min — ABNORMAL LOW (ref >90)
GFR calc non Af Amer: 48 mL/min — ABNORMAL LOW (ref >90)
Sodium: 134 mEq/L — ABNORMAL LOW (ref 135–145)

## 2011-03-18 LAB — DIFFERENTIAL
Basophils Absolute: 0 10*3/uL (ref 0.0–0.1)
Basophils Relative: 0 % (ref 0–1)
Eosinophils Relative: 1 % (ref 0–5)
Lymphocytes Relative: 20 % (ref 12–46)
Monocytes Absolute: 0.7 10*3/uL (ref 0.1–1.0)

## 2011-03-18 LAB — CBC
MCHC: 34.5 g/dL (ref 30.0–36.0)
MCV: 90.7 fL (ref 78.0–100.0)
Platelets: 173 10*3/uL (ref 150–400)
RDW: 13.2 % (ref 11.5–15.5)
WBC: 6.2 10*3/uL (ref 4.0–10.5)

## 2011-03-18 MED ORDER — METHYLPREDNISOLONE SODIUM SUCC 125 MG IJ SOLR
80.0000 mg | Freq: Four times a day (QID) | INTRAMUSCULAR | Status: DC
  Administered 2011-03-18 – 2011-03-19 (×3): 80 mg via INTRAVENOUS
  Filled 2011-03-18 (×6): qty 2

## 2011-03-18 MED ORDER — GABAPENTIN 300 MG PO CAPS
600.0000 mg | ORAL_CAPSULE | Freq: Four times a day (QID) | ORAL | Status: DC
  Administered 2011-03-18 – 2011-03-21 (×12): 600 mg via ORAL
  Filled 2011-03-18 (×14): qty 2

## 2011-03-18 MED ORDER — SODIUM CHLORIDE 0.9 % IV BOLUS (SEPSIS)
500.0000 mL | Freq: Once | INTRAVENOUS | Status: AC
  Administered 2011-03-18: 500 mL via INTRAVENOUS

## 2011-03-18 MED ORDER — ALBUTEROL SULFATE (5 MG/ML) 0.5% IN NEBU
5.0000 mg | INHALATION_SOLUTION | Freq: Once | RESPIRATORY_TRACT | Status: AC
  Administered 2011-03-18: 5 mg via RESPIRATORY_TRACT
  Filled 2011-03-18 (×2): qty 0.5

## 2011-03-18 MED ORDER — IPRATROPIUM BROMIDE 0.02 % IN SOLN
RESPIRATORY_TRACT | Status: AC
  Administered 2011-03-18: 0.5 mg via RESPIRATORY_TRACT
  Filled 2011-03-18: qty 2.5

## 2011-03-18 MED ORDER — GABAPENTIN 600 MG PO TABS
600.0000 mg | ORAL_TABLET | Freq: Four times a day (QID) | ORAL | Status: DC
  Filled 2011-03-18 (×6): qty 1

## 2011-03-18 MED ORDER — OSELTAMIVIR PHOSPHATE 75 MG PO CAPS
75.0000 mg | ORAL_CAPSULE | Freq: Two times a day (BID) | ORAL | Status: DC
  Administered 2011-03-18 – 2011-03-21 (×6): 75 mg via ORAL
  Filled 2011-03-18 (×7): qty 1

## 2011-03-18 MED ORDER — DEXTROSE 5 % IV SOLN
1.0000 g | INTRAVENOUS | Status: DC
  Administered 2011-03-18: 1 g via INTRAVENOUS
  Filled 2011-03-18 (×2): qty 10

## 2011-03-18 MED ORDER — ALBUTEROL SULFATE (5 MG/ML) 0.5% IN NEBU
2.5000 mg | INHALATION_SOLUTION | Freq: Four times a day (QID) | RESPIRATORY_TRACT | Status: DC
  Administered 2011-03-18: 2.5 mg via RESPIRATORY_TRACT
  Filled 2011-03-18: qty 0.5

## 2011-03-18 MED ORDER — IPRATROPIUM BROMIDE 0.02 % IN SOLN
0.5000 mg | Freq: Four times a day (QID) | RESPIRATORY_TRACT | Status: DC
  Administered 2011-03-18: 0.5 mg via RESPIRATORY_TRACT
  Filled 2011-03-18: qty 2.5

## 2011-03-18 MED ORDER — IPRATROPIUM BROMIDE 0.02 % IN SOLN
0.5000 mg | Freq: Once | RESPIRATORY_TRACT | Status: AC
  Administered 2011-03-18: 0.5 mg via RESPIRATORY_TRACT
  Filled 2011-03-18: qty 2.5

## 2011-03-18 MED ORDER — ENOXAPARIN SODIUM 40 MG/0.4ML ~~LOC~~ SOLN
40.0000 mg | SUBCUTANEOUS | Status: DC
  Administered 2011-03-18 – 2011-03-20 (×3): 40 mg via SUBCUTANEOUS
  Filled 2011-03-18 (×4): qty 0.4

## 2011-03-18 MED ORDER — ACETAMINOPHEN 325 MG PO TABS: 650.0000 mg | ORAL_TABLET | Freq: Four times a day (QID) | ORAL | Status: DC | PRN

## 2011-03-18 MED ORDER — ASPIRIN EC 81 MG PO TBEC
81.0000 mg | DELAYED_RELEASE_TABLET | Freq: Every day | ORAL | Status: DC
  Administered 2011-03-19 – 2011-03-21 (×3): 81 mg via ORAL
  Filled 2011-03-18 (×3): qty 1

## 2011-03-18 MED ORDER — DEXTROSE 5 % IV SOLN
500.0000 mg | INTRAVENOUS | Status: DC
  Administered 2011-03-18: 500 mg via INTRAVENOUS
  Filled 2011-03-18 (×2): qty 500

## 2011-03-18 MED ORDER — ALBUTEROL SULFATE (5 MG/ML) 0.5% IN NEBU
5.0000 mg | INHALATION_SOLUTION | RESPIRATORY_TRACT | Status: DC | PRN
  Filled 2011-03-18 (×2): qty 0.5

## 2011-03-18 MED ORDER — ACETAMINOPHEN 650 MG RE SUPP: 650.0000 mg | Freq: Four times a day (QID) | RECTAL | Status: DC | PRN

## 2011-03-18 MED ORDER — IPRATROPIUM BROMIDE 0.02 % IN SOLN
0.5000 mg | RESPIRATORY_TRACT | Status: DC | PRN
  Filled 2011-03-18: qty 2.5

## 2011-03-18 MED ORDER — LOSARTAN POTASSIUM 50 MG PO TABS
100.0000 mg | ORAL_TABLET | Freq: Every day | ORAL | Status: DC
  Administered 2011-03-18 – 2011-03-19 (×2): 100 mg via ORAL
  Filled 2011-03-18 (×2): qty 2

## 2011-03-18 MED ORDER — IPRATROPIUM BROMIDE 0.02 % IN SOLN
0.5000 mg | Freq: Three times a day (TID) | RESPIRATORY_TRACT | Status: DC
  Administered 2011-03-19 – 2011-03-21 (×7): 0.5 mg via RESPIRATORY_TRACT
  Filled 2011-03-18 (×7): qty 2.5

## 2011-03-18 MED ORDER — ALBUTEROL SULFATE (5 MG/ML) 0.5% IN NEBU
INHALATION_SOLUTION | RESPIRATORY_TRACT | Status: AC
  Administered 2011-03-18: 5 mg via RESPIRATORY_TRACT
  Filled 2011-03-18: qty 1

## 2011-03-18 MED ORDER — GUAIFENESIN ER 600 MG PO TB12
1200.0000 mg | ORAL_TABLET | Freq: Two times a day (BID) | ORAL | Status: DC
  Administered 2011-03-18 – 2011-03-21 (×6): 1200 mg via ORAL
  Filled 2011-03-18 (×7): qty 2

## 2011-03-18 MED ORDER — METOPROLOL SUCCINATE ER 50 MG PO TB24
50.0000 mg | ORAL_TABLET | Freq: Every day | ORAL | Status: DC
  Administered 2011-03-19 – 2011-03-21 (×3): 50 mg via ORAL
  Filled 2011-03-18 (×3): qty 1

## 2011-03-18 MED ORDER — AMLODIPINE BESYLATE 10 MG PO TABS
10.0000 mg | ORAL_TABLET | Freq: Every day | ORAL | Status: DC
  Administered 2011-03-19 – 2011-03-21 (×3): 10 mg via ORAL
  Filled 2011-03-18 (×3): qty 1

## 2011-03-18 MED ORDER — SIMVASTATIN 20 MG PO TABS
20.0000 mg | ORAL_TABLET | Freq: Every day | ORAL | Status: DC
  Administered 2011-03-18 – 2011-03-20 (×3): 20 mg via ORAL
  Filled 2011-03-18 (×4): qty 1

## 2011-03-18 MED ORDER — METHYLPREDNISOLONE SODIUM SUCC 125 MG IJ SOLR
125.0000 mg | INTRAMUSCULAR | Status: DC
  Administered 2011-03-18: 125 mg via INTRAVENOUS
  Filled 2011-03-18: qty 2

## 2011-03-18 MED ORDER — ALBUTEROL SULFATE (5 MG/ML) 0.5% IN NEBU
2.5000 mg | INHALATION_SOLUTION | Freq: Three times a day (TID) | RESPIRATORY_TRACT | Status: DC
  Administered 2011-03-19 – 2011-03-21 (×7): 2.5 mg via RESPIRATORY_TRACT
  Filled 2011-03-18 (×6): qty 0.5

## 2011-03-18 MED ORDER — VITAMIN D3 25 MCG (1000 UNIT) PO TABS
2000.0000 [IU] | ORAL_TABLET | Freq: Three times a day (TID) | ORAL | Status: DC
  Administered 2011-03-18 – 2011-03-21 (×8): 2000 [IU] via ORAL
  Filled 2011-03-18 (×10): qty 2

## 2011-03-18 MED ORDER — CLONAZEPAM 0.5 MG PO TABS
0.5000 mg | ORAL_TABLET | Freq: Three times a day (TID) | ORAL | Status: DC | PRN
  Administered 2011-03-18 – 2011-03-20 (×3): 0.5 mg via ORAL
  Filled 2011-03-18 (×3): qty 1

## 2011-03-18 NOTE — Plan of Care (Signed)
Problem: Phase I Progression Outcomes Goal: Progress activity as tolerated unless otherwise ordered Outcome: Completed/Met Date Met:  03/18/11 Pt insists on getting up going to the bathroom with assistance to void; instructed pt that he has to have help since he is unstable on his feet. Bed Alarm activated in case pt forgets.

## 2011-03-18 NOTE — ED Notes (Signed)
Per EMS-SOB/acute CHF SX's X3-4 days-saw PCP today and was advised to get checked out in ED-denies CP, smokes 1 pack QD-non-productive cough-s/p fall 1 week ago, healing bruises under both eyes and bridge of nose

## 2011-03-18 NOTE — H&P (Addendum)
PCP:   Lonnie Bamberger, NP, NP   Chief Complaint:  Shortness of breath  HPI: Lonnie Barron is an 78 year old male with a 90-pack-year smoking history, diabetes and multiple other medical problems, started coughing 4-5 days ago, this was associated with congestion in his chest and fevers. He subsequently went to the urgent care on Friday was prescribed an albuterol inhaler and Z-Pak and discharged home he also had an Xray chest done at the time which was unremarkable. He stopped the Z-Pak after one dose as he had an episode of diarrhea associated with this, has also had worsening cough, shortness of breath, wheezing, fevers and chills despite this. He went to see his primary care doctor today who sent him to the hospital for evaluation. He denies any chest pain, reports upper abdominal pain with coughing a lot.  Allergies:   Allergies  Allergen Reactions  . Ciprofloxacin Hives, Shortness Of Breath and Itching  . Tramadol   . Codeine Anxiety      Past Medical History  Diagnosis Date  . Hypertension   . PAC (premature atrial contraction)   . PVC's (premature ventricular contractions)   . Tobacco dependence   . Diabetes mellitus   . Hyperlipidemia   . Prostate cancer   . Osteoarthritis   . Peripheral neuropathy   . Complication of anesthesia     ?, became limp after surgery -was hospitalized  . COPD (chronic obstructive pulmonary disease)     Past Surgical History  Procedure Date  . Replacement total knee   . Total shoulder replacement   . Cholecystectomy   . Cardiovascular stress test 03/27/2007    EF 51%    Prior to Admission medications   Medication Sig Start Date End Date Taking? Authorizing Provider  albuterol (PROVENTIL HFA;VENTOLIN HFA) 108 (90 BASE) MCG/ACT inhaler Inhale 2 puffs into the lungs every 4 (four) hours as needed for wheezing or shortness of breath. 03/15/11 03/14/12 Yes Richardo Priest, MD  amLODipine (NORVASC) 10 MG tablet Take 10 mg by mouth daily.     Yes  Historical Provider, MD  aspirin EC 81 MG tablet Take 81 mg by mouth daily.   Yes Historical Provider, MD  azithromycin (ZITHROMAX Z-PAK) 250 MG tablet Take 1 tablet (250 mg total) by mouth daily. Take two tablets on first day, then one tablet each day for four days 03/15/11 03/20/11 Yes Richardo Priest, MD  b complex vitamins tablet Take 1 tablet by mouth daily.     Yes Historical Provider, MD  benazepril (LOTENSIN) 40 MG tablet Take 40 mg by mouth daily.     Yes Historical Provider, MD  cholecalciferol (VITAMIN D) 1000 UNITS tablet Take 2,000 Units by mouth 3 (three) times daily.   Yes Historical Provider, MD  clonazePAM (KLONOPIN) 0.5 MG tablet Take 0.5 mg by mouth 3 (three) times daily as needed. For anxiety   Yes Historical Provider, MD  fish oil-omega-3 fatty acids 1000 MG capsule Take 1 g by mouth daily.     Yes Historical Provider, MD  gabapentin (NEURONTIN) 600 MG tablet Take 600 mg by mouth 4 (four) times daily.   Yes Historical Provider, MD  guaiFENesin (MUCINEX) 600 MG 12 hr tablet Take 1,200 mg by mouth 2 (two) times daily.   Yes Historical Provider, MD  ibuprofen (ADVIL,MOTRIN) 200 MG tablet Take 1,000 mg by mouth every 6 (six) hours as needed. For pain/fever   Yes Historical Provider, MD  losartan (COZAAR) 100 MG tablet Take 1 tablet by mouth At bedtime.  10/17/10  Yes Historical Provider, MD  metFORMIN (GLUCOPHAGE) 500 MG tablet Take 1,000 mg by mouth 2 (two) times daily with a meal.   Yes Historical Provider, MD  metoprolol succinate (TOPROL-XL) 50 MG 24 hr tablet Take 50 mg by mouth daily. Take with or immediately following a meal.   Yes Historical Provider, MD  OVER THE COUNTER MEDICATION Take 2 tablets by mouth 2 (two) times daily. Formula 303. Muscle relaxer.   Yes Historical Provider, MD  simvastatin (ZOCOR) 20 MG tablet Take 20 mg by mouth at bedtime.     Yes Historical Provider, MD  vitamin E 400 UNIT capsule Take 400 Units by mouth daily.     Yes Historical Provider, MD    Social  History: Lives at home with his wife,  smokes 1.5 pack per day for 65 years, drinks alcohol only occasionally, he is independent in ADLs    Family History  Problem Relation Age of Onset  . COPD Mother   . Stroke Father     Review of Systems:  Constitutional: Denies fever, chills, diaphoresis, appetite change and fatigue.  HEENT: Denies photophobia, eye pain, redness, hearing loss, ear pain, congestion, sore throat, rhinorrhea, sneezing, mouth sores, trouble swallowing, neck pain, neck stiffness and tinnitus.   Respiratory: Denies SOB, DOE, cough, chest tightness,  and wheezing.   Cardiovascular: Denies chest pain, palpitations and leg swelling.  Gastrointestinal: Denies nausea, vomiting, abdominal pain, diarrhea, constipation, blood in stool and abdominal distention.  Genitourinary: Denies dysuria, urgency, frequency, hematuria, flank pain and difficulty urinating.  Musculoskeletal: Denies myalgias, back pain, joint swelling, arthralgias and gait problem.  Skin: Denies pallor, rash and wound.  Neurological: Denies dizziness, seizures, syncope, weakness, light-headedness, numbness and headaches.  Hematological: Denies adenopathy. Easy bruising, personal or family bleeding history  Psychiatric/Behavioral: Denies suicidal ideation, mood changes, confusion, nervousness, sleep disturbance and agitation   Physical Exam: Blood pressure 114/62, pulse 86, resp. rate 30, SpO2 96.00%. General exam: Average he built white male laying in the stretcher in no acute distress, not using accessory muscles of respiration and able to speak in full sentences  HEENT pupils equal reactive to light, bruise overlying the bridge of his nose from recent fall CVS S1-S2 regular rate rhythm no murmurs rubs or gallops Lungs: Rhonchi throughout lung fields, poor air movement Abdomen soft nontender with normal bowel sounds no organomegaly Extremities with no edema clubbing or cyanosis Neuro moves all extremities no  localizing signs  Labs on Admission:  Results for orders placed during the hospital encounter of 03/18/11 (from the past 48 hour(s))  CBC     Status: Abnormal   Collection Time   03/18/11 12:30 PM      Component Value Range Comment   WBC 6.2  4.0 - 10.5 (K/uL)    RBC 4.19 (*) 4.22 - 5.81 (MIL/uL)    Hemoglobin 13.1  13.0 - 17.0 (g/dL)    HCT 16.1 (*) 09.6 - 52.0 (%)    MCV 90.7  78.0 - 100.0 (fL)    MCH 31.3  26.0 - 34.0 (pg)    MCHC 34.5  30.0 - 36.0 (g/dL)    RDW 04.5  40.9 - 81.1 (%)    Platelets 173  150 - 400 (K/uL)   DIFFERENTIAL     Status: Normal   Collection Time   03/18/11 12:30 PM      Component Value Range Comment   Neutrophils Relative 67  43 - 77 (%)    Neutro Abs 4.2  1.7 -  7.7 (K/uL)    Lymphocytes Relative 20  12 - 46 (%)    Lymphs Abs 1.3  0.7 - 4.0 (K/uL)    Monocytes Relative 12  3 - 12 (%)    Monocytes Absolute 0.7  0.1 - 1.0 (K/uL)    Eosinophils Relative 1  0 - 5 (%)    Eosinophils Absolute 0.1  0.0 - 0.7 (K/uL)    Basophils Relative 0  0 - 1 (%)    Basophils Absolute 0.0  0.0 - 0.1 (K/uL)   BASIC METABOLIC PANEL     Status: Abnormal   Collection Time   03/18/11 12:30 PM      Component Value Range Comment   Sodium 134 (*) 135 - 145 (mEq/L)    Potassium 4.1  3.5 - 5.1 (mEq/L)    Chloride 99  96 - 112 (mEq/L)    CO2 25  19 - 32 (mEq/L)    Glucose, Bld 153 (*) 70 - 99 (mg/dL)    BUN 23  6 - 23 (mg/dL)    Creatinine, Ser 9.60  0.50 - 1.35 (mg/dL)    Calcium 9.9  8.4 - 10.5 (mg/dL)    GFR calc non Af Amer 48 (*) >90 (mL/min)    GFR calc Af Amer 56 (*) >90 (mL/min)   LACTIC ACID, PLASMA     Status: Abnormal   Collection Time   03/18/11 12:30 PM      Component Value Range Comment   Lactic Acid, Venous 2.5 (*) 0.5 - 2.2 (mmol/L)     Radiological Exams on Admission: Dg Chest 2 View  03/18/2011  *RADIOLOGY REPORT*  Clinical Data: Shortness of breath  CHEST - 2 VIEW  Comparison: 03/15/2011  Findings: The lungs are clear without focal infiltrate, edema,  pneumothorax or pleural effusion. The cardiopericardial silhouette is within normal limits for size. Bones are diffusely demineralized.  The patient has bilateral shoulder replacement. Telemetry leads overlie the chest.  IMPRESSION: No acute findings.  Original Report Authenticated By: ERIC A. MANSELL, M.D.    Assessment/Plan 1.  COPD with exacerbation 2. Bronchitis, acute 3. Weakness generalized 4. Gait disorder 5. diabetes mellitus 6. chronic tobacco dependence Plan: Admit him to telemetry floor, start him on IV Rocephin and Zithromax Start IV Solu-Medrol 80 mg every 6 hours, albuterol and Atrovent nebulizations every 6 hours and when necessary Guaifenesin for cough Will also start Tamiflu and check influenza PCR Check sputum cultures If clinically deteriorates from a respiratory standpoint may need BiPAP otherwise he is a DO NOT RESUSCITATE For diabetes will put him on a sliding scale and hold his metformin for now, at risk of hyperglycemia due to IV steroids  Tobacco cessation counseled  Will Benefit from Pulmonary Follow up, and PFTs to document the degree of COPD PT eval  DNR Time Spent on Admission:  [facility] Pager: [phone] 03/18/2011, 4:01 PM

## 2011-03-18 NOTE — ED Provider Notes (Addendum)
History     CSN: [id number]  Arrival date & time 03/18/11  1150   First MD Initiated Contact with Patient 03/18/11 1208      Chief Complaint  Patient presents with  . Shortness of Breath     Patient is a 78 y.o. male presenting with shortness of breath.  Shortness of Breath  Associated symptoms include shortness of breath.   Per EMS-SOB/acute X3-4 days-saw PCP today and was advised to get checked out in ED-denies CP, smokes 1 pack QD-non-productive cough-s/p fall 1 week ago, healing bruises under both eyes and bridge of nose.  Patient had febrile illness last week on Friday with temperature as high as 101.6.  Recently seen in urgent care or and started on azithromycin.  Shortness of breath and weakness have increased over the weekend.  Past Medical History  Diagnosis Date  . Hypertension   . PAC (premature atrial contraction)   . PVC's (premature ventricular contractions)   . Tobacco dependence   . Diabetes mellitus   . Hyperlipidemia   . Prostate cancer   . Osteoarthritis   . Peripheral neuropathy     Past Surgical History  Procedure Date  . Replacement total knee   . Total shoulder replacement   . Cholecystectomy   . Cardiovascular stress test 03/27/2007    EF 51%    Family History  Problem Relation Age of Onset  . COPD Mother   . Stroke Father     History  Substance Use Topics  . Smoking status: Current Everyday Smoker -- 1.5 packs/day  . Smokeless tobacco: Not on file  . Alcohol Use: No      Review of Systems  Respiratory: Positive for shortness of breath.     Allergies  Ciprofloxacin and Codeine  Home Medications   Current Outpatient Rx  Name Route Sig Dispense Refill  . ALBUTEROL SULFATE HFA 108 (90 BASE) MCG/ACT IN AERS Inhalation Inhale 2 puffs into the lungs every 4 (four) hours as needed for wheezing or shortness of breath. 1 Inhaler 0  . AMLODIPINE BESYLATE 10 MG PO TABS Oral Take 10 mg by mouth daily.      . ASPIRIN EC 81 MG PO TBEC  Oral Take 81 mg by mouth daily.    . AZITHROMYCIN 250 MG PO TABS Oral Take 1 tablet (250 mg total) by mouth daily. Take two tablets on first day, then one tablet each day for four days 6 tablet 0  . B COMPLEX PO TABS Oral Take 1 tablet by mouth daily.      Marland Kitchen BENAZEPRIL HCL 40 MG PO TABS Oral Take 40 mg by mouth daily.      Marland Kitchen VITAMIN D 1000 UNITS PO TABS Oral Take 2,000 Units by mouth 3 (three) times daily.    Marland Kitchen CLONAZEPAM 0.5 MG PO TABS Oral Take 0.5 mg by mouth 3 (three) times daily as needed. For anxiety    . OMEGA-3 FATTY ACIDS 1000 MG PO CAPS Oral Take 1 g by mouth daily.      Marland Kitchen GABAPENTIN 600 MG PO TABS Oral Take 600 mg by mouth 4 (four) times daily.    . GUAIFENESIN ER 600 MG PO TB12 Oral Take 1,200 mg by mouth 2 (two) times daily.    . IBUPROFEN 200 MG PO TABS Oral Take 1,000 mg by mouth every 6 (six) hours as needed. For pain/fever    . LOSARTAN POTASSIUM 100 MG PO TABS Oral Take 1 tablet by mouth At bedtime.    Marland Kitchen  METFORMIN HCL 500 MG PO TABS Oral Take 1,000 mg by mouth 2 (two) times daily with a meal.    . METOPROLOL SUCCINATE ER 50 MG PO TB24 Oral Take 50 mg by mouth daily. Take with or immediately following a meal.    . OVER THE COUNTER MEDICATION Oral Take 2 tablets by mouth 2 (two) times daily. Formula 303. Muscle relaxer.    Marland Kitchen SIMVASTATIN 20 MG PO TABS Oral Take 20 mg by mouth at bedtime.      Marland Kitchen VITAMIN E 400 UNITS PO CAPS Oral Take 400 Units by mouth daily.        BP 114/62  Pulse 86  Resp 30  SpO2 96%  Physical Exam  Nursing note and vitals reviewed. Constitutional: He is oriented to person, place, and time. He appears well-developed and well-nourished. No distress.  HENT:  Head: Normocephalic and atraumatic.  Eyes: Pupils are equal, round, and reactive to light.  Neck: Normal range of motion.  Cardiovascular: Normal rate and intact distal pulses.   Pulmonary/Chest: Accessory muscle usage present. Tachypnea noted. He has wheezes. He has rales.    Abdominal: Normal  appearance. He exhibits no distension.  Musculoskeletal: Normal range of motion.  Neurological: He is alert and oriented to person, place, and time. No cranial nerve deficit.  Skin: Skin is warm and dry. No rash noted.  Psychiatric: He has a normal mood and affect. His behavior is normal.    ED Course  Procedures (including critical care time)  Labs Reviewed  CBC - Abnormal; Notable for the following:    RBC 4.19 (*)    HCT 38.0 (*)    All other components within normal limits  BASIC METABOLIC PANEL - Abnormal; Notable for the following:    Sodium 134 (*)    Glucose, Bld 153 (*)    GFR calc non Af Amer 48 (*)    GFR calc Af Amer 56 (*)    All other components within normal limits  LACTIC ACID, PLASMA - Abnormal; Notable for the following:    Lactic Acid, Venous 2.5 (*)    All other components within normal limits  DIFFERENTIAL   Dg Chest 2 View  03/18/2011  *RADIOLOGY REPORT*  Clinical Data: Shortness of breath  CHEST - 2 VIEW  Comparison: 03/15/2011  Findings: The lungs are clear without focal infiltrate, edema, pneumothorax or pleural effusion. The cardiopericardial silhouette is within normal limits for size. Bones are diffusely demineralized.  The patient has bilateral shoulder replacement. Telemetry leads overlie the chest.  IMPRESSION: No acute findings.  Original Report Authenticated By: ERIC A. MANSELL, M.D.     1. COPD exacerbation   2. Tachypnea       MDM          Nelia Shi, MD 03/18/11 1519  Nelia Shi, MD 03/18/11 [phone]

## 2011-03-18 NOTE — ED Notes (Signed)
AVW:[id number]<XB> Expected date:03/18/11<BR> Expected time:11:39 AM<BR> Means of arrival:Ambulance<BR> Comments:<BR> M12. 78 yo m. From doc office. SOB x days with exertion. Audible congestion. 96% O2 sat per EMS. 10 min+ eta

## 2011-03-19 LAB — HEMOGLOBIN A1C
Hgb A1c MFr Bld: 6.8 % — ABNORMAL HIGH (ref <5.7)
Mean Plasma Glucose: 148 mg/dL — ABNORMAL HIGH (ref <117)

## 2011-03-19 LAB — CBC
MCV: 89.9 fL (ref 78.0–100.0)
Platelets: 192 10*3/uL (ref 150–400)
RBC: 4.25 MIL/uL (ref 4.22–5.81)
RDW: 13 % (ref 11.5–15.5)
WBC: 5.5 10*3/uL (ref 4.0–10.5)

## 2011-03-19 LAB — BASIC METABOLIC PANEL
Chloride: 99 mEq/L (ref 96–112)
Creatinine, Ser: 1.06 mg/dL (ref 0.50–1.35)
GFR calc Af Amer: 74 mL/min — ABNORMAL LOW (ref >90)
Sodium: 134 mEq/L — ABNORMAL LOW (ref 135–145)

## 2011-03-19 LAB — GLUCOSE, CAPILLARY: Glucose-Capillary: 320 mg/dL — ABNORMAL HIGH (ref 70–99)

## 2011-03-19 LAB — INFLUENZA PANEL BY PCR (TYPE A & B): Influenza A By PCR: POSITIVE — AB

## 2011-03-19 MED ORDER — GUAIFENESIN 100 MG/5ML PO SOLN
5.0000 mL | ORAL | Status: DC | PRN
  Administered 2011-03-19: 100 mg via ORAL
  Filled 2011-03-19: qty 10

## 2011-03-19 MED ORDER — METHYLPREDNISOLONE SODIUM SUCC 125 MG IJ SOLR
80.0000 mg | Freq: Two times a day (BID) | INTRAMUSCULAR | Status: DC
  Administered 2011-03-19 – 2011-03-20 (×3): 80 mg via INTRAVENOUS
  Filled 2011-03-19 (×5): qty 1.28

## 2011-03-19 MED ORDER — INSULIN ASPART 100 UNIT/ML ~~LOC~~ SOLN
0.0000 [IU] | Freq: Three times a day (TID) | SUBCUTANEOUS | Status: DC
  Administered 2011-03-19 – 2011-03-20 (×2): 7 [IU] via SUBCUTANEOUS
  Administered 2011-03-20 (×2): 9 [IU] via SUBCUTANEOUS
  Filled 2011-03-19: qty 3

## 2011-03-19 MED ORDER — DOXYCYCLINE HYCLATE 100 MG PO TABS
100.0000 mg | ORAL_TABLET | Freq: Two times a day (BID) | ORAL | Status: DC
  Administered 2011-03-19 – 2011-03-21 (×5): 100 mg via ORAL
  Filled 2011-03-19 (×6): qty 1

## 2011-03-19 MED ORDER — LOSARTAN POTASSIUM 50 MG PO TABS
50.0000 mg | ORAL_TABLET | Freq: Every day | ORAL | Status: DC
  Administered 2011-03-20 – 2011-03-21 (×2): 50 mg via ORAL
  Filled 2011-03-19 (×2): qty 1

## 2011-03-19 MED ORDER — INSULIN ASPART 100 UNIT/ML ~~LOC~~ SOLN
0.0000 [IU] | Freq: Every day | SUBCUTANEOUS | Status: DC
  Administered 2011-03-19: 4 [IU] via SUBCUTANEOUS
  Administered 2011-03-20: 5 [IU] via SUBCUTANEOUS
  Filled 2011-03-19: qty 3

## 2011-03-19 NOTE — Progress Notes (Addendum)
Lonnie Barron [id number] is a 78 y.o. male,  Outpatient Primary MD for the patient is [name], NP, NP  Chief Complaint  Patient presents with  . Shortness of Breath        Subjective:   Lonnie Barron today has, No headache, No chest pain, No abdominal pain - No Nausea, No new weakness tingling or numbness, improved Cough - SOB.   Objective:   Filed Vitals:   03/18/11 1652 03/18/11 2145 03/19/11 0547 03/19/11 0958  BP:  109/44 121/83   Pulse:  91 78   Temp:  97.9 F (36.6 C) 98.2 F (36.8 C)   TempSrc:  Oral Oral   Resp:  20 19   Height: 6\' 2"  (1.88 m)     Weight: 89.132 kg (196 lb 8 oz)     SpO2:  98% 94% 95%    Wt Readings from Last 3 Encounters:  03/18/11 89.132 kg (196 lb 8 oz)  10/31/10 105.144 kg (231 lb 12.8 oz)     Intake/Output Summary (Last 24 hours) at 03/19/11 1314 Last data filed at 03/19/11 0600  Gross per 24 hour  Intake   1020 ml  Output    950 ml  Net     70 ml    Exam Awake Alert, Oriented *3, No new F.N deficits, Normal affect [place].AT,PERRAL Supple Neck,No JVD, No cervical lymphadenopathy appriciated.  Symmetrical Chest wall movement, Good air movement bilaterally, few wheezes RRR,No Gallops,Rubs or new Murmurs, No Parasternal Heave +ve B.Sounds, Abd Soft, Non tender, No organomegaly appriciated, No rebound -guarding or rigidity. No Cyanosis, Clubbing or edema, No new Rash, stable facial bruise    Data Review  CBC  Lab 03/19/11 0450 03/18/11 1230  WBC 5.5 6.2  HGB 13.2 13.1  HCT 38.2* 38.0*  PLT 192 173  MCV 89.9 90.7  MCH 31.1 31.3  MCHC 34.6 34.5  RDW 13.0 13.2  LYMPHSABS -- 1.3  MONOABS -- 0.7  EOSABS -- 0.1  BASOSABS -- 0.0  BANDABS -- --    Chemistries   Lab 03/19/11 0450 03/18/11 1230  NA 134* 134*  K 4.9 4.1  CL 99 99  CO2 21 25  GLUCOSE 312* 153*  BUN 24* 23  CREATININE 1.06 1.35  CALCIUM 10.0 9.9  MG -- --  AST -- --  ALT -- --  ALKPHOS -- --  BILITOT -- --    ------------------------------------------------------------------------------------------------------------------ estimated creatinine clearance is 64.6 ml/min (by C-G formula based on Cr of 1.06). ------------------------------------------------------------------------------------------------------------------ No results found for this basename: HGBA1C:2 in the last 72 hours ------------------------------------------------------------------------------------------------------------------ No results found for this basename: CHOL:2,HDL:2,LDLCALC:2,TRIG:2,CHOLHDL:2,LDLDIRECT:2 in the last 72 hours ------------------------------------------------------------------------------------------------------------------ No results found for this basename: TSH,T4TOTAL,FREET3,T3FREE,THYROIDAB in the last 72 hours ------------------------------------------------------------------------------------------------------------------ No results found for this basename: VITAMINB12:2,FOLATE:2,FERRITIN:2,TIBC:2,IRON:2,RETICCTPCT:2 in the last 72 hours  Coagulation profile No results found for this basename: INR:5,PROTIME:5 in the last 168 hours  No results found for this basename: DDIMER:2 in the last 72 hours  Cardiac Enzymes No results found for this basename: CK:3,CKMB:3,TROPONINI:3,MYOGLOBIN:3 in the last 168 hours ------------------------------------------------------------------------------------------------------------------ No components found with this basename: POCBNP:3  Micro Results No results found for this or any previous visit (from the past 240 hour(s)).  Radiology Reports Dg Chest 2 View  03/18/2011  *RADIOLOGY REPORT*  Clinical Data: Shortness of breath  CHEST - 2 VIEW  Comparison: 03/15/2011  Findings: The lungs are clear without focal infiltrate, edema, pneumothorax or pleural effusion. The cardiopericardial silhouette is within normal limits for size. Bones are diffusely demineralized.  The patient has bilateral shoulder replacement. Telemetry leads overlie the chest.  IMPRESSION: No acute findings.  Original Report Authenticated By: ERIC A. MANSELL, M.D.   Dg Chest 2 View  03/15/2011  *RADIOLOGY REPORT*  Clinical Data: Shortness of breath, fever, chest pain  CHEST - 2 VIEW  Comparison: CT chest dated 06/01/2008  Findings: Mild bronchitic changes.  Lungs otherwise essentially clear. No pleural effusion or pneumothorax.  Cardiomediastinal silhouette is within normal limits.  Mild degenerative changes of the visualized thoracolumbar spine.  Bilateral shoulder arthroplasty.  IMPRESSION: Mild bronchitic changes.  Original Report Authenticated By: Charline Bills, M.D.    Scheduled Meds:   . albuterol  2.5 mg Nebulization TID  . albuterol  5 mg Nebulization Once  . amLODipine  10 mg Oral Daily  . aspirin EC  81 mg Oral Daily  . cholecalciferol  2,000 Units Oral TID  . doxycycline  100 mg Oral Q12H  . enoxaparin  40 mg Subcutaneous Q24H  . gabapentin  600 mg Oral QID  . guaiFENesin  1,200 mg Oral BID  . ipratropium  0.5 mg Nebulization Once  . ipratropium  0.5 mg Nebulization TID  . losartan  50 mg Oral Daily  . methylPREDNISolone (SOLU-MEDROL) injection  80 mg Intravenous Q12H  . metoprolol succinate  50 mg Oral Daily  . oseltamivir  75 mg Oral BID  . simvastatin  20 mg Oral QHS  . sodium chloride  500 mL Intravenous Once  . DISCONTD: albuterol  2.5 mg Nebulization Q6H  . DISCONTD: azithromycin  500 mg Intravenous Q24H  . DISCONTD: cefTRIAXone (ROCEPHIN)  IV  1 g Intravenous Q24H  . DISCONTD: gabapentin  600 mg Oral QID  . DISCONTD: ipratropium  0.5 mg Nebulization Q6H  . DISCONTD: losartan  100 mg Oral Daily  . DISCONTD: methylPREDNISolone (SOLU-MEDROL) injection  125 mg Intravenous STAT  . DISCONTD: methylPREDNISolone (SOLU-MEDROL) injection  80 mg Intravenous Q6H   Continuous Infusions:  PRN Meds:.acetaminophen, acetaminophen, albuterol, clonazePAM,  ipratropium  Assessment & Plan   1. Fevers, cough & shortness of breath caused COPD with exacerbation - in part by influenza A infection, patient on Tamiflu, agree with IV Solu-Medrol patient is already improved so we'll cut down to twice a day does, his chest x-ray is clear I do not think he is a bacterial pneumonia although superimposed bacterial bronchitis cannot be ruled out for his COPD purposes I will put him on doxycycline, and we'll continue to get nebulizer treatments and oxygen as needed, presume rapid taper of steroid.  2.DM-2 - Will check a hemoglobin A1c, agree with holding Glucophage in the setting of acute illness and his age, insulin sliding scale will be added.   3. Generalized weakness with recent fall and facial bruising 3-4 days prior to hospital admission, patient has no headache, no neurological deficit, he was seen by PT OT.  4. Hypertension currently stable although I have noted that his potassium is high normal at 4.9, I will reduce his Cozaar dose to have with holding orders potassium 5, he is on Norvasc be continued continue monitoring patient's blood pressure.   5. patient was counseled to quit smoking.   DVT Prophylaxis  Lovenox    See all Orders from today for further details     Leroy Sea M.D on 03/19/2011 at 1:14 PM  [facility] Office  [phone]

## 2011-03-19 NOTE — Progress Notes (Signed)
UR completed 

## 2011-03-19 NOTE — Clinical Documentation Improvement (Signed)
Abnormal Labs Clarification  THIS DOCUMENT IS NOT A PERMANENT PART OF THE MEDICAL RECORD  TO RESPOND TO THE THIS QUERY, FOLLOW THE INSTRUCTIONS BELOW:  1. If needed, update documentation for the patient's encounter via the notes activity.  2. Access this query again and click edit on the [employer].  3. After updating, or not, click F2 to complete all highlighted (required) fields concerning your review. Select "additional documentation in the medical record" OR "no additional documentation provided".  4. Click Sign note button.  5. The deficiency will fall out of your InBasket *Please let us know if you are not able to complete this workflow by phone or e-mail (listed below).  Please update your documentation within the medical record to reflect your response to this query.                                                                                   03/19/11  Dear Dr. Thedore Mins Marton Redwood  In a better effort to capture your patient's severity of illness, reflect appropriate length of stay and utilization of resources, a review of the medical record has revealed the following indicators.    Based on your clinical judgment, please clarify and document in a progress note and/or discharge summary the clinical condition associated with the following supporting information:  In responding to this query please exercise your independent judgment.  The fact that a query is asked, does not imply that any particular answer is desired or expected.  Abnormal findings (laboratory, x-ray, pathologic, and other diagnostic results) are not coded and reported unless the physician indicates their clinical significance.   The medical record reflects the following clinical findings, please clarify the diagnostic and/or clinical significance:                                       Please clarify in progress notes , if possible, the underlying condition(s) for abn labs listed below. Thank you. Possible  Clinical Conditions?   -Hyponatremia -Acute Renal Failure/AKI -Chronic Renal Failure -Acute on Chronic renal failure Other Condition___________________                Cannot Clinically Determine_________   Clinical Information:                03/18/2011      03/19/2011  Sodium   134 (L)         134 (L)  Chloride  99                    99  BUN        23                    24 (H)  Creat     1.35                1.06  GFR calc non Af Amer >90 mL/min      48 (L)             64(L)   NS Bolus 500 ml  Reviewed:  no additional documentation provided   Thank You,  Andy Gauss RN  Clinical Documentation Specialist:  Pager [phone] E-mail [email or web address]  Health Information Management [place]

## 2011-03-19 NOTE — Evaluation (Signed)
Occupational Therapy Evaluation Patient Details Name: Lonnie Barron MRN: [id number] DOB: 09-18-1928 Today's Date: 03/19/2011 1530 1600 ev2  Problem List:  Patient Active Problem List  Diagnoses  . Hypertension  . PAC (premature atrial contraction)  . PVC's (premature ventricular contractions)  . Tobacco dependence  . Hyperlipidemia  . COPD with exacerbation  . Bronchitis, acute  . Weakness generalized  . Gait disorder    Past Medical History:  Past Medical History  Diagnosis Date  . Hypertension   . PAC (premature atrial contraction)   . PVC's (premature ventricular contractions)   . Tobacco dependence   . Diabetes mellitus   . Hyperlipidemia   . Prostate cancer   . Osteoarthritis   . Peripheral neuropathy   . Complication of anesthesia     ?, became limp after surgery -was hospitalized  . COPD (chronic obstructive pulmonary disease)    Past Surgical History:  Past Surgical History  Procedure Date  . Replacement total knee   . Total shoulder replacement   . Cholecystectomy   . Cardiovascular stress test 03/27/2007    EF 51%    OT Assessment/Plan/Recommendation OT Recommendation Follow Up Recommendations: Other (comment);Supervision/Assistance - 24 hour (continue OP PT for shoulder if pt has assist at home) Equipment Recommended: None recommended by OT OT Goals Acute Rehab OT Goals OT Goal Formulation: With patient Time For Goal Achievement: 2 weeks ADL Goals Pt Will Transfer to Toilet: with min assist;Comfort height toilet (min guard with AD prn) ADL Goal: Toilet Transfer - Progress: Goal set today Pt Will Perform Tub/Shower Transfer: Shower transfer;with min assist;Shower seat with back (min guard) ADL Goal: Tub/Shower Transfer - Progress: Goal set today Arm Goals Additional Arm Goal #1: Pt will tolerate 110 degrees FF PROM to increase UE recovery from TSA and improve ADL function Arm Goal: Additional Goal #1 - Progress: Goal set today  OT  Evaluation Precautions/Restrictions  Restrictions Weight Bearing Restrictions: No Other Position/Activity Restrictions: spoke to [place], PT at [facility] re:  shoulder protocol (Dr Donald Prose) .  Limited to PROM 140 FF; 40 ER; no internal or abduction.  Pt had been doing wand exercises.  Pt has been using arm actively Prior Functioning Home Living Lives With: Spouse Type of Home: House Home Layout: One level Bathroom Shower/Tub: Health visitor: Handicapped height Home Adaptive Equipment: Other (comment) (has chair he can use in the shower.  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 ADL ADL Eating/Feeding: Simulated;Set up Where Assessed - Eating/Feeding: Edge of bed Grooming: Simulated;Set up Where Assessed - Grooming: Sitting, bed;Unsupported Upper Body Bathing: Simulated;Set up Where Assessed - Upper Body Bathing: Sitting, bed;Unsupported Lower Body Bathing: Simulated;Moderate assistance Where Assessed - Lower Body Bathing: Sit to stand from bed Upper Body Dressing: Simulated;Minimal assistance Where Assessed - Upper Body Dressing: Sitting, bed;Unsupported Lower Body Dressing: Simulated;Moderate assistance Where Assessed - Lower Body Dressing: Sit to stand from bed Toilet Transfer: Simulated;Minimal assistance;Other (comment) (ambulated to bathroom without device:  pt unsteady) Toilet Transfer Method:  (simulated with bed) Toileting - Clothing Manipulation: Simulated;Minimal assistance Where Assessed - Toileting Clothing Manipulation: Standing Toileting - Hygiene: Simulated;Minimal assistance Where Assessed - Toileting Hygiene: Standing Equipment Used: Other (comment) (gait belt) Ambulation Related to ADLs: min A ambulating to bathroom, no device; unsteady but no LOB ADL Comments: Pt sore and left UE tighter impacting adls Vision/Perception     Cognition Cognition Overall Cognitive  Status: Appears within functional limits for tasks assessed Sensation/Coordination Sensation Additional Comments: reports LE peripheral neuropathy Extremity Assessment LUE Assessment LUE Assessment: Exceptions to [facility] LUE AROM (degrees) LUE Overall AROM Comments: pt had shoulder replacement in Nov.  Now limited AROM 80 FF; 90 PROM Left Shoulder External Rotation  0-90:  (allowed 40 PROM; NT) Mobility  Bed Mobility Bed Mobility: Yes Supine to Sit: 6: Modified independent (Device/Increase time);With rails Transfers Transfers: Yes Sit to Stand: 4: Min assist;Other (comment) (min guard) Exercises   End of Session OT - End of Session Equipment Utilized During Treatment: Gait belt Activity Tolerance: Patient tolerated treatment well Patient left: in bed;with bed alarm set;with call bell in reach General Behavior During Session: [facility] for tasks performed Cognition: [facility] for tasks performed Marica Otter, OTR/L [phone] 03/19/2011  Trinidi Toppins 03/19/2011, 4:40 PM

## 2011-03-19 NOTE — Progress Notes (Signed)
Inpatient Diabetes Program Recommendations  AACE/ADA: New Consensus Statement on Inpatient Glycemic Control (2009)  Target Ranges:  Prepandial:   less than 140 mg/dL      Peak postprandial:   less than 180 mg/dL (1-2 hours)      Critically ill patients:  140 - 180 mg/dL   Reason for Visit: Hyperglycemia  Results for Lonnie Barron, Lonnie Barron (MRN [id number]) as of 03/19/2011 12:48  Ref. Range 03/18/2011 12:30 03/18/2011 12:59 03/18/2011 17:05 03/19/2011 04:50  Glucose Latest Range: 70-99 mg/dL 119 (H)   147 (H)    Inpatient Diabetes Program Recommendations Correction (SSI): Add Novolog sensitive tidwc. Insulin - Meal Coverage: may need meal coverage insulin while on solumedrol.  Thank you. Ailene Ards, RD, LDN, CDE

## 2011-03-20 LAB — GLUCOSE, CAPILLARY
Glucose-Capillary: 375 mg/dL — ABNORMAL HIGH (ref 70–99)
Glucose-Capillary: 385 mg/dL — ABNORMAL HIGH (ref 70–99)

## 2011-03-20 LAB — BASIC METABOLIC PANEL
Chloride: 102 mEq/L (ref 96–112)
GFR calc Af Amer: 68 mL/min — ABNORMAL LOW (ref >90)
Potassium: 4.7 mEq/L (ref 3.5–5.1)

## 2011-03-20 NOTE — Progress Notes (Signed)
Occupational Therapy Treatment Patient Details Name: Lonnie Barron MRN: [id number] DOB: 03/31/1928 Today's Date: 03/20/2011  OT Assessment/Plan OT Assessment/Plan Comments on Treatment Session: Pt to continue with skilled OT services to increase independence and maximize level of function to return home safely OT Plan: Discharge plan remains appropriate Equipment Recommended: None recommended by PT OT Goals ADL Goals Pt Will Transfer to Toilet:  (min guard assist) ADL Goal: Toilet Transfer - Progress: Progressing toward goals Pt Will Perform Tub/Shower Transfer: Other (comment) (not addressed) Arm Goals Additional Arm Goal #1: Pt provided with PROM to L shoulder in FF 90 - 95 degrees and ext rotation per restrictions of no greater than 140 degrees FF and 40 degrees ext rotation. Pt unable to recall precautions and required cues to not provide AROM during PROM by therapist Arm Goal: Additional Goal #1 - Progress: Progressing toward goals  OT Treatment Precautions/Restrictions  Precautions Precautions: Fall Precaution Comments: L shoulder ROM restrictions Required Braces or Orthoses: No Restrictions Weight Bearing Restrictions: No    Mobility  Bed Mobility Bed Mobility: Yes Supine to Sit: 6: Modified independent (Device/Increase time) Transfers Sit to Stand: 5: Supervision, to toilet, cues to not use UEs for assist pulling on grab bars Sit to Stand Details (indicate cue type and reason): pt in bathroom upon my entry and soiled with stool; stood from toilet using grabbar; from recliner chair without UE assist. Exercises Other Exercises Other Exercises: PROM L shoulder, FF 90 - 95 degrees, ext rotation 40 degrees  End of Session OT - End of Session Equipment Utilized During Treatment: Gait belt Activity Tolerance: Patient tolerated treatment well Patient left: in bed;with call bell in reach General Behavior During Session: [name] for tasks performed Cognition: [name] for tasks  performed  [name]  03/20/2011, 2:30 PM

## 2011-03-20 NOTE — Progress Notes (Signed)
Lonnie Barron [id number] is a 78 y.o. male,  Outpatient Primary MD for the patient is Lonnie Bamberger, NP, NP  Chief Complaint  Patient presents with  . Shortness of Breath        Subjective:   States breathing much better, also decreased cough. Objective:   Filed Vitals:   03/20/11 0935 03/20/11 1002 03/20/11 1413 03/20/11 1530  BP: 157/77  132/73   Pulse: 74  76   Temp:   97.4 F (36.3 C)   TempSrc:   Oral   Resp:   18   Height:      Weight:      SpO2: 96% 94% 96% 94%    Wt Readings from Last 3 Encounters:  03/18/11 89.132 kg (196 lb 8 oz)  10/31/10 105.144 kg (231 lb 12.8 oz)     Intake/Output Summary (Last 24 hours) at 03/20/11 1928 Last data filed at 03/20/11 1700  Gross per 24 hour  Intake   1560 ml  Output      0 ml  Net   1560 ml    Exam Awake Alert, Oriented *3, No new F.N deficits, Normal affect [place].AT,PERRAL Supple Neck,No JVD, No cervical lymphadenopathy appreciated.  Symmetrical Chest wall movement,moderate movement bilaterally, no wheezes RRR,No Gallops,Rubs or new Murmurs, No Parasternal Heave +ve B.Sounds, Abd Soft, Non tender, No organomegaly appriciated, No rebound -guarding or rigidity. No Cyanosis, Clubbing or edema, No new Rash, stable facial bruise    Data Review  CBC  Lab 03/19/11 0450 03/18/11 1230  WBC 5.5 6.2  HGB 13.2 13.1  HCT 38.2* 38.0*  PLT 192 173  MCV 89.9 90.7  MCH 31.1 31.3  MCHC 34.6 34.5  RDW 13.0 13.2  LYMPHSABS -- 1.3  MONOABS -- 0.7  EOSABS -- 0.1  BASOSABS -- 0.0  BANDABS -- --    Chemistries   Lab 03/20/11 0505 03/19/11 0450 03/18/11 1230  NA 134* 134* 134*  K 4.7 4.9 4.1  CL 102 99 99  CO2 24 21 25   GLUCOSE 325* 312* 153*  BUN 27* 24* 23  CREATININE 1.14 1.06 1.35  CALCIUM 9.8 10.0 9.9  MG -- -- --  AST -- -- --  ALT -- -- --  ALKPHOS -- -- --  BILITOT -- -- --    ------------------------------------------------------------------------------------------------------------------ estimated creatinine clearance is 60.1 ml/min (by C-G formula based on Cr of 1.14). ------------------------------------------------------------------------------------------------------------------  Basename 03/19/11 1400  HGBA1C 6.8*   ------------------------------------------------------------------------------------------------------------------ No results found for this basename: CHOL:2,HDL:2,LDLCALC:2,TRIG:2,CHOLHDL:2,LDLDIRECT:2 in the last 72 hours ------------------------------------------------------------------------------------------------------------------ No results found for this basename: TSH,T4TOTAL,FREET3,T3FREE,THYROIDAB in the last 72 hours ------------------------------------------------------------------------------------------------------------------ No results found for this basename: VITAMINB12:2,FOLATE:2,FERRITIN:2,TIBC:2,IRON:2,RETICCTPCT:2 in the last 72 hours  Coagulation profile No results found for this basename: INR:5,PROTIME:5 in the last 168 hours  No results found for this basename: DDIMER:2 in the last 72 hours  Cardiac Enzymes No results found for this basename: CK:3,CKMB:3,TROPONINI:3,MYOGLOBIN:3 in the last 168 hours ------------------------------------------------------------------------------------------------------------------ No components found with this basename: POCBNP:3  Micro Results No results found for this or any previous visit (from the past 240 hour(s)).  Radiology Reports Dg Chest 2 View  03/18/2011  *RADIOLOGY REPORT*  Clinical Data: Shortness of breath  CHEST - 2 VIEW  Comparison: 03/15/2011  Findings: The lungs are clear without focal infiltrate, edema, pneumothorax or pleural effusion. The cardiopericardial silhouette is within normal limits for size. Bones are diffusely demineralized.  The patient has bilateral  shoulder replacement. Telemetry leads overlie the chest.  IMPRESSION: No acute findings.  Original Report Authenticated  By: ERIC A. MANSELL, M.D.   Dg Chest 2 View  03/15/2011  *RADIOLOGY REPORT*  Clinical Data: Shortness of breath, fever, chest pain  CHEST - 2 VIEW  Comparison: CT chest dated 06/01/2008  Findings: Mild bronchitic changes.  Lungs otherwise essentially clear. No pleural effusion or pneumothorax.  Cardiomediastinal silhouette is within normal limits.  Mild degenerative changes of the visualized thoracolumbar spine.  Bilateral shoulder arthroplasty.  IMPRESSION: Mild bronchitic changes.  Original Report Authenticated By: Charline Bills, M.D.    Scheduled Meds:    . albuterol  2.5 mg Nebulization TID  . amLODipine  10 mg Oral Daily  . aspirin EC  81 mg Oral Daily  . cholecalciferol  2,000 Units Oral TID  . doxycycline  100 mg Oral Q12H  . enoxaparin  40 mg Subcutaneous Q24H  . gabapentin  600 mg Oral QID  . guaiFENesin  1,200 mg Oral BID  . insulin aspart  0-5 Units Subcutaneous QHS  . insulin aspart  0-9 Units Subcutaneous TID WC  . ipratropium  0.5 mg Nebulization TID  . losartan  50 mg Oral Daily  . methylPREDNISolone (SOLU-MEDROL) injection  80 mg Intravenous Q12H  . metoprolol succinate  50 mg Oral Daily  . oseltamivir  75 mg Oral BID  . simvastatin  20 mg Oral QHS   Continuous Infusions:  PRN Meds:.acetaminophen, acetaminophen, albuterol, clonazePAM, guaiFENesin, ipratropium  Assessment & Plan   1. Influenza A - continue tamiflu, Pt now afebrile and clinically improved 2.COPD exacerbation - improving on current regimen, will change to oral steroids in am, if continues to improve plan d/c soon  2.DM-2 -steroids contributing to poor BG control, will decrease as above. Increase ssi and add lantus for now and follow   hemoglobin A1c 6.8, agree with holding Glucophage in the setting of acute illness and his age.   3. Generalized weakness with recent fall and  facial bruising 3-4 days prior to hospital admission, no neurological deficit, he was seen by PT OT- outpt PT for balance training recommended.  4. Hypertension -stable, continue current lorsartan, metoprolol and norvasc, no further rise in k noted today.   5. patient was counseled to quit smoking, and he voices understanding but  States he has no intention to quit- that he is 78yo and he will do what he enjoys doing.   DVT Prophylaxis  Lovenox         Nayquan Evinger C M.D on 03/20/2011 at 7:28 PM  [facility] Office  [phone]

## 2011-03-20 NOTE — Evaluation (Signed)
Physical Therapy Evaluation Patient Details Name: Lonnie Barron MRN: [id number] DOB: 07-28-1928 Today's Date: 03/20/2011  Problem List:  Patient Active Problem List  Diagnoses  . Hypertension  . PAC (premature atrial contraction)  . PVC's (premature ventricular contractions)  . Tobacco dependence  . Hyperlipidemia  . COPD with exacerbation  . Bronchitis, acute  . Weakness generalized  . Gait disorder    Past Medical History:  Past Medical History  Diagnosis Date  . Hypertension   . PAC (premature atrial contraction)   . PVC's (premature ventricular contractions)   . Tobacco dependence   . Diabetes mellitus   . Hyperlipidemia   . Prostate cancer   . Osteoarthritis   . Peripheral neuropathy   . Complication of anesthesia     ?, became limp after surgery -was hospitalized  . COPD (chronic obstructive pulmonary disease)    Past Surgical History:  Past Surgical History  Procedure Date  . Replacement total knee   . Total shoulder replacement   . Cholecystectomy   . Cardiovascular stress test 03/27/2007    EF 51%    PT Assessment/Plan/Recommendation PT Assessment Clinical Impression Statement: Patient with COPD/Influenza presents with decreased balance, generalized weakness and recent falls.  Reports using walker at home since the recent falls.  Also stated PT at [facility] whom he sees for shoulder rehab has given him balance exercises in the past.  Advised him to continue using walker at home for fall prevention, to walk in hallway assisted while here and feel he will benefit from skilled PT during acute stay for further LE strengh and balance training.  Encouraged continue to follow up with outpatient PT upon d/c for both shoulder rehab and balance training. PT Recommendation/Assessment: Patient will need skilled PT in the acute care venue PT Problem List: Decreased strength;Decreased balance;Decreased mobility PT Therapy Diagnosis : Abnormality of gait;Generalized  weakness PT Plan PT Frequency: Min 3X/week PT Treatment/Interventions: DME instruction;Gait training;Functional mobility training;Patient/family education;Therapeutic activities;Therapeutic exercise;Balance training PT Recommendation Follow Up Recommendations: Outpatient PT Equipment Recommended: None recommended by PT PT Goals  Acute Rehab PT Goals PT Goal Formulation: With patient Time For Goal Achievement: 7 days Pt will go Sit to Stand: Independently PT Goal: Sit to Stand - Progress: Goal set today Pt will go Stand to Sit: Independently PT Goal: Stand to Sit - Progress: Goal set today Pt will Stand: Independently;3 - 5 min;with unilateral upper extremity support;with no upper extremity support (with 1 UE support as needed during simple functional task) PT Goal: Stand - Progress: Goal set today Pt will Ambulate: >150 feet;with least restrictive assistive device;with modified independence PT Goal: Ambulate - Progress: Goal set today Pt will Perform Home Exercise Program: Independently PT Goal: Perform Home Exercise Program - Progress: Goal set today  PT Evaluation Precautions/Restrictions  Precautions Precaution Comments: 2 recent falls at home had to have sons come and assist him up.  Has facial bruising Restrictions Weight Bearing Restrictions: No Prior Functioning  Home Living Lives With: Spouse Type of Home: House Home Layout: One level Home Access: Stairs to enter Entrance Stairs-Rails: None Entrance Stairs-Number of Steps: 1 Prior Function Level of Independence: Independent with basic ADLs;Independent with gait;Requires assistive device for independence (reports after recent falls been using RW at home) Cognition Cognition Arousal/Alertness: Awake/alert Overall Cognitive Status: Appears within functional limits for tasks assessed Orientation Level: Oriented X4 Sensation/Coordination   Extremity Assessment RLE Assessment RLE Assessment: Within Functional Limits  (has healing eschar on both knees from prior fall) LLE  Assessment LLE Assessment: Within Functional Limits Mobility (including Balance) Bed Mobility Bed Mobility: No (pt. OOB) Transfers Sit to Stand: 5: Supervision;Without upper extremity assist;From chair/3-in-1;From toilet;With upper extremity assist Sit to Stand Details (indicate cue type and reason): pt in bathroom upon my entry and soiled with stool; stood from toilet using grabbar; from recliner chair without UE assist. Ambulation/Gait Ambulation/Gait: Yes Ambulation/Gait Assistance: 5: Supervision Ambulation/Gait Assistance Details (indicate cue type and reason): increased BOS, decreased stride length, slightly antalgic on right, forward flexed and c/o increasing back pain Ambulation Distance (Feet): 200 Feet Assistive device: None  Posture/Postural Control Posture/Postural Control: Postural limitations Postural Limitations: forward head, trunk slightly forward flexed Balance Balance Assessed: Yes Static Standing Balance Static Standing - Balance Support: No upper extremity supported;Left upper extremity supported Static Standing - Level of Assistance: 6: Modified independent (Device/Increase time) Static Standing - Comment/# of Minutes: stood few minutes while holding grabbar while being assisted for hygiene; stood to wash hands without UE assist, up in room unaided to walk to b/s table  Exercise    End of Session PT - End of Session Equipment Utilized During Treatment: Gait belt Activity Tolerance: Patient tolerated treatment well Patient left: in chair;with call bell in reach General Behavior During Session: [facility] for tasks performed Cognition: [facility] for tasks performed  [facility] 03/20/2011, 12:00 PM

## 2011-03-21 MED ORDER — TIOTROPIUM BROMIDE MONOHYDRATE 18 MCG IN CAPS: 18.0000 ug | ORAL_CAPSULE | Freq: Every day | RESPIRATORY_TRACT | Status: DC

## 2011-03-21 MED ORDER — INSULIN GLARGINE 100 UNIT/ML ~~LOC~~ SOLN
10.0000 [IU] | Freq: Every day | SUBCUTANEOUS | Status: DC
  Administered 2011-03-21: 10 [IU] via SUBCUTANEOUS
  Filled 2011-03-21: qty 3

## 2011-03-21 MED ORDER — INSULIN ASPART 100 UNIT/ML ~~LOC~~ SOLN
0.0000 [IU] | Freq: Three times a day (TID) | SUBCUTANEOUS | Status: DC
  Administered 2011-03-21 (×2): 8 [IU] via SUBCUTANEOUS

## 2011-03-21 MED ORDER — DOXYCYCLINE HYCLATE 100 MG PO TABS: 100.0000 mg | ORAL_TABLET | Freq: Two times a day (BID) | ORAL | Status: AC

## 2011-03-21 MED ORDER — BUDESONIDE-FORMOTEROL FUMARATE 160-4.5 MCG/ACT IN AERO: 2.0000 | INHALATION_SPRAY | Freq: Two times a day (BID) | RESPIRATORY_TRACT | Status: DC

## 2011-03-21 MED ORDER — PREDNISONE 50 MG PO TABS
50.0000 mg | ORAL_TABLET | Freq: Every day | ORAL | Status: DC
  Administered 2011-03-21: 50 mg via ORAL
  Filled 2011-03-21: qty 1

## 2011-03-21 MED ORDER — PREDNISONE (PAK) 10 MG PO TABS: ORAL_TABLET | ORAL | Status: DC

## 2011-03-21 MED ORDER — OSELTAMIVIR PHOSPHATE 75 MG PO CAPS: 75.0000 mg | ORAL_CAPSULE | Freq: Two times a day (BID) | ORAL | Status: AC

## 2011-03-21 NOTE — Progress Notes (Signed)
   CARE MANAGEMENT NOTE 03/21/2011  Patient:  Lonnie Barron, Lonnie Barron   Account Number:  [id number]  Date Initiated:  03/21/2011  Documentation initiated by:  Mylene Bow  Subjective/Objective Assessment:   PT ADM WITH COPD EXACERBATION     Action/Plan:   LIVES WITH WIFE. ACTIVE WITH OUTPATIENT PHYSICAL THERAPY   Anticipated DC Date:  03/21/2011   Anticipated DC Plan:  HOME/SELF CARE  In-house referral  NA      DC Planning Services  CM consult      PAC Choice  NA   Choice offered to / List presented to:  NA   DME arranged  NA      DME agency  NA     HH arranged  NA      HH agency  NA   Status of service:  Completed, signed off Medicare Important Message given?  NA - LOS <3 / Initial given by admissions (If response is "NO", the following Medicare IM given date fields will be blank) Date Medicare IM given:   Date Additional Medicare IM given:    Discharge Disposition:  HOME/SELF CARE  Per UR Regulation:  Reviewed for med. necessity/level of care/duration of stay  Comments:  03-21-11 Lonnie Barron [phone] Spoke with patient and wife at bedside prior to dc. Therapy recs outpatient physical therapy. Lonnie Barron already goes to outpatient physical therapy at [facility] for recent shoulder surgery. Therefore therapy is not needed to be set up by this CM. Lonnie Barron indicates that his therapist is aware of him being admitted to the hospital. This cm answered questions regarding equipment needs and so forth from the wife. Both patient and wife were appreciative of the visit. Will f/u with Dr. Toni Arthurs, PCP after dc.

## 2011-03-21 NOTE — Progress Notes (Signed)
Pt and his wife voices understanding of d/c instructions. No changes noted since am assessment.

## 2011-03-21 NOTE — Discharge Summary (Signed)
Discharge Note  Name: Lonnie Barron MRN: [id number] DOB: 1928/12/20 78 y.o.  Date of Admission: 03/18/2011 11:51 AM Date of Discharge: 03/21/2011 Attending Physician: Kela Millin, MD  Discharge Diagnosis: Active Problems:  COPD with exacerbation  Bronchitis, acute  Weakness generalized  Gait disorder   Discharge Medications: Medication List  As of 03/21/2011  1:30 PM   STOP taking these medications         azithromycin 250 MG tablet      benazepril 40 MG tablet      ibuprofen 200 MG tablet         TAKE these medications         albuterol 108 (90 BASE) MCG/ACT inhaler   Commonly known as: PROVENTIL HFA;VENTOLIN HFA   Inhale 2 puffs into the lungs every 4 (four) hours as needed for wheezing or shortness of breath.      amLODipine 10 MG tablet   Commonly known as: NORVASC   Take 10 mg by mouth daily.      aspirin EC 81 MG tablet   Take 81 mg by mouth daily.      b complex vitamins tablet   Take 1 tablet by mouth daily.      budesonide-formoterol 160-4.5 MCG/ACT inhaler   Commonly known as: SYMBICORT   Inhale 2 puffs into the lungs 2 (two) times daily.      cholecalciferol 1000 UNITS tablet   Commonly known as: VITAMIN D   Take 2,000 Units by mouth 3 (three) times daily.      clonazePAM 0.5 MG tablet   Commonly known as: KLONOPIN   Take 0.5 mg by mouth 3 (three) times daily as needed. For anxiety      doxycycline 100 MG tablet   Commonly known as: VIBRA-TABS   Take 1 tablet (100 mg total) by mouth every 12 (twelve) hours.      fish oil-omega-3 fatty acids 1000 MG capsule   Take 1 g by mouth daily.      gabapentin 600 MG tablet   Commonly known as: NEURONTIN   Take 600 mg by mouth 4 (four) times daily.      guaiFENesin 600 MG 12 hr tablet   Commonly known as: MUCINEX   Take 1,200 mg by mouth 2 (two) times daily.      losartan 100 MG tablet   Commonly known as: COZAAR   Take 1 tablet by mouth At bedtime.      metFORMIN 500 MG tablet   Commonly known as: GLUCOPHAGE   Take 1,000 mg by mouth 2 (two) times daily with a meal.      metoprolol succinate 50 MG 24 hr tablet   Commonly known as: TOPROL-XL   Take 50 mg by mouth daily. Take with or immediately following a meal.      oseltamivir 75 MG capsule   Commonly known as: TAMIFLU   Take 1 capsule (75 mg total) by mouth 2 (two) times daily.      OVER THE COUNTER MEDICATION   Take 2 tablets by mouth 2 (two) times daily. Formula 303. Muscle relaxer.      predniSONE 10 MG tablet   Commonly known as: STERAPRED UNI-PAK   Take 4 tablets daily for 3days, then 3 tablets daily for 3 days, then 2 tablets daily for 3 days, then 1 tablet daily for 3 days, then stop.  Take with food      simvastatin 20 MG tablet   Commonly known as: ZOCOR  Take 20 mg by mouth at bedtime.      tiotropium 18 MCG inhalation capsule   Commonly known as: SPIRIVA   Place 1 capsule (18 mcg total) into inhaler and inhale daily.      vitamin E 400 UNIT capsule   Take 400 Units by mouth daily.            Disposition and follow-up:   Lonnie Barron was discharged from [facility] in improved/stable condition.    Follow-up Appointments: Discharge Orders    Future Orders Please Complete By Expires   Diet Carb Modified      Increase activity slowly      Call MD for:  temperature >100.4         Consultations:    Procedures Performed:  Dg Chest 2 View  03/18/2011  *RADIOLOGY REPORT*  Clinical Data: Shortness of breath  CHEST - 2 VIEW  Comparison: 03/15/2011  Findings: The lungs are clear without focal infiltrate, edema, pneumothorax or pleural effusion. The cardiopericardial silhouette is within normal limits for size. Bones are diffusely demineralized.  The patient has bilateral shoulder replacement. Telemetry leads overlie the chest.  IMPRESSION: No acute findings.  Original Report Authenticated By: ERIC A. MANSELL, M.D.   Dg Chest 2 View  03/15/2011  *RADIOLOGY  REPORT*  Clinical Data: Shortness of breath, fever, chest pain  CHEST - 2 VIEW  Comparison: CT chest dated 06/01/2008  Findings: Mild bronchitic changes.  Lungs otherwise essentially clear. No pleural effusion or pneumothorax.  Cardiomediastinal silhouette is within normal limits.  Mild degenerative changes of the visualized thoracolumbar spine.  Bilateral shoulder arthroplasty.  IMPRESSION: Mild bronchitic changes.  Original Report Authenticated By: Charline Bills, M.D.   Brief history Lonnie Barron is an 78 year old male with a 90-pack-year smoking history, diabetes and multiple other medical problems, started coughing 4-5 days ago, this was associated with congestion in his chest and fevers.  He subsequently went to the urgent care on Friday was prescribed an albuterol inhaler and Z-Pak and discharged home he also had an Xray chest done at the time which was unremarkable. He stopped the Z-Pak after one dose as he had an episode of diarrhea associated with this, has also had worsening cough, shortness of breath, wheezing, fevers and chills despite this.  He went to see his primary care doctor on the day of admission and was sent  to the hospital for evaluation.  He denied any chest pain, reported upper abdominal pain with coughing a lot.    Physical exam Awake Alert, Oriented *3, No new F.N deficits, Normal affect  Lonnie Barron.AT,PERRAL  Supple Neck,No JVD, No cervical lymphadenopathy appreciated.  Symmetrical Chest wall movement,moderate air movement bilaterally, no wheezes  RRR,No Gallops,Rubs or new Murmurs, No Parasternal Heave  +ve B.Sounds, Abd Soft, Non tender, No organomegaly appriciated, No rebound -guarding or rigidity.  No Cyanosis, Clubbing or edema, No new Rash, stable facial bruise   Hospital Course by problem list: Active Problems: Influenza A COPD with exacerbation Weakness generalized /Gait disorder Diabetes type 2 Hypertension Tobacco abuse  1. Influenza A  Upon admission and  influenza panel was done and it came back positive for influenza A. The patient was placed on Tamiflu, and responded well to treatment. He defervesced and has improved clinically. To be discharged, this time and he was to complete a total of 5 days of Tamiflu. His to followup with his primary care 2.COPD exacerbation  Upon admission he was placed on nebulized bronchodilators along  with her steroids. Expectorant/antitussives were also added to his treatment regimen, as well as oral antibiotics. A chest x-ray was done on admission showed no acute findings. he was counseled to quit tobacco but he indicated that he would continue to smoke because he enjoyed doing so and that he understood the risks/consequences of not stopping. His symptoms are improved  on the above regimen, the Solu-Medrol was changed to prednisone and his continued to do well. His O2 sats were checked on our room air in the setting of 93-96%. He does not meet requirements for home O2. He is clinically improved at this time and will be discharged home to followup with his primary care physician. 2.DM-2 -His metformin was held in the hospital, his Accu-Cheks were monitored and was covered with sliding scale insulin. His blood sugars are within poorly controlled with him being on steroids and he received Lantus. He has been instructed to resume his metformin upon discharge, his prednisone is being tapered and I expect his blood glucose control to continue to improve as the prednisone is completely tapered off. He had hemoglobin A1c done in the hospital and it was 6.8, his to followup with his primary care physician. 3. Generalized weakness with recent fall and facial bruising 3-4 days prior to hospital admission, no neurological deficit, he was seen by PT OT- outpt PT for balance training recommended. His to follow up outpatient the 4. Hypertension - he was maintained on lorsartan, metoprolol and norvasc, while in the hospital. His Benicar was  discontinued in the hospital and he has been instructed to stay off it, and follow up with his primary care physician for further monitoring of his blood pressures and treatment as appropriate for optimal control. 5. tobacco abuse -patient was counseled to quit smoking, and he voiced understanding but Stated he has no intention to quit- that he is 78yo and he will do what he enjoys doing.      Discharge Vitals:  BP 125/65  Pulse 74  Temp(Src) 97.8 F (36.6 C) (Oral)  Resp 20  Ht 6\' 2"  (1.88 m)  Wt 89.132 kg (196 lb 8 oz)  BMI 25.23 kg/m2  SpO2 96%  Discharge Labs:  Results for orders placed during the hospital encounter of 03/18/11 (from the past 24 hour(s))  GLUCOSE, CAPILLARY     Status: Abnormal   Collection Time   03/20/11  4:37 PM      Component Value Range   Glucose-Capillary 385 (*) 70 - 99 (mg/dL)  GLUCOSE, CAPILLARY     Status: Abnormal   Collection Time   03/20/11 10:03 PM      Component Value Range   Glucose-Capillary 354 (*) 70 - 99 (mg/dL)   Comment 1 Notify RN    GLUCOSE, CAPILLARY     Status: Abnormal   Collection Time   03/21/11  7:15 AM      Component Value Range   Glucose-Capillary 286 (*) 70 - 99 (mg/dL)   Comment 1 Notify RN    GLUCOSE, CAPILLARY     Status: Abnormal   Collection Time   03/21/11 11:08 AM      Component Value Range   Glucose-Capillary 294 (*) 70 - 99 (mg/dL)   Comment 1 Notify RN      SignedDonnalee Curry C 03/21/2011, 1:30 PM

## 2011-04-02 ENCOUNTER — Ambulatory Visit: Payer: Medicare Other | Attending: Pulmonary Disease | Admitting: Sleep Medicine

## 2011-04-02 DIAGNOSIS — G4733 Obstructive sleep apnea (adult) (pediatric): Secondary | ICD-10-CM | POA: Insufficient documentation

## 2011-04-02 DIAGNOSIS — G4700 Insomnia, unspecified: Secondary | ICD-10-CM

## 2011-04-08 NOTE — Procedures (Signed)
NAME:  Lonnie Barron, Lonnie Barron NO.:  [id number]  MEDICAL RECORD NO.:  [id number]          PATIENT TYPE:  OUT  LOCATION:  SLEEP LAB                     FACILITY:  [facility]  PHYSICIAN:  Neha Waight A. Gerilyn Pilgrim, M.D. DATE OF BIRTH:  1928/10/20  DATE OF STUDY:  04/02/2011                           NOCTURNAL POLYSOMNOGRAM  REFERRING PHYSICIAN:  Ramon Dredge L. Juanetta Gosling, M.D.  INDICATION:  This is an 78 year old who presents with loud snoring, fatigue, restless sleep, and insomnia.  There is also daytime sleepiness.  The study is being done to evaluate for obstructive sleep apnea syndrome.  MEDICATIONS:  Amlodipine, gabapentin, aspirin, vitamin B12, metformin, fish oil, vitamins D, vitamin E, Symbicort, simvastatin, Spiriva, losartan, albuterol.  ARCHITECTURAL SUMMARY:  This is a split night recording with initial portion being a diagnostic and the second portion a titration recording. The total recording time is 434 minutes.  Sleep efficiency 76%.  Sleep latency 18.5 minutes.  REM latency 181.5 minutes.  RESPIRATORY SUMMARY:  Baseline oxygen saturation 95, lowest saturation 89.  Diagnostic AHI is 16.5.  The patient was placed on CPAP between 5 and 10, had some difficulties with tolerating this and subsequently switched to bilevels between 10/5 to 11/5.  Optimal pressure 11/5.  LIMB MOVEMENT SUMMARY:  PLM index is 7.  ELECTROCARDIOGRAM SUMMARY:  Average heart rate is 67 with no significant dysrhythmias observed.  IMPRESSION:  Moderate obstructive sleep apnea syndrome, which responds well to bilevel pressure of 11/5.    Tikesha Mort A. Gerilyn Pilgrim, M.D.    KAD/MEDQ  D:  04/08/2011 09:02:20  T:  04/08/2011 09:14:42  Job:  [id number]

## 2011-04-09 ENCOUNTER — Institutional Professional Consult (permissible substitution): Payer: Medicare Other | Admitting: Critical Care Medicine

## 2011-04-10 ENCOUNTER — Institutional Professional Consult (permissible substitution): Payer: Medicare Other | Admitting: Critical Care Medicine

## 2011-04-18 ENCOUNTER — Other Ambulatory Visit: Payer: Self-pay

## 2011-04-18 ENCOUNTER — Emergency Department (HOSPITAL_COMMUNITY): Payer: Medicare Other

## 2011-04-18 ENCOUNTER — Emergency Department (HOSPITAL_COMMUNITY)
Admission: EM | Admit: 2011-04-18 | Discharge: 2011-04-18 | Disposition: A | Discharge status: Home Health | Payer: Medicare Other | Attending: Emergency Medicine | Admitting: Emergency Medicine

## 2011-04-18 ENCOUNTER — Encounter (HOSPITAL_COMMUNITY): Payer: Self-pay | Admitting: Emergency Medicine

## 2011-04-18 DIAGNOSIS — K118 Other diseases of salivary glands: Secondary | ICD-10-CM | POA: Insufficient documentation

## 2011-04-18 DIAGNOSIS — E785 Hyperlipidemia, unspecified: Secondary | ICD-10-CM | POA: Insufficient documentation

## 2011-04-18 DIAGNOSIS — R269 Unspecified abnormalities of gait and mobility: Secondary | ICD-10-CM | POA: Insufficient documentation

## 2011-04-18 DIAGNOSIS — R55 Syncope and collapse: Secondary | ICD-10-CM | POA: Insufficient documentation

## 2011-04-18 DIAGNOSIS — J4489 Other specified chronic obstructive pulmonary disease: Secondary | ICD-10-CM | POA: Insufficient documentation

## 2011-04-18 DIAGNOSIS — Z7982 Long term (current) use of aspirin: Secondary | ICD-10-CM | POA: Insufficient documentation

## 2011-04-18 DIAGNOSIS — Z79899 Other long term (current) drug therapy: Secondary | ICD-10-CM | POA: Insufficient documentation

## 2011-04-18 DIAGNOSIS — G609 Hereditary and idiopathic neuropathy, unspecified: Secondary | ICD-10-CM | POA: Insufficient documentation

## 2011-04-18 DIAGNOSIS — J449 Chronic obstructive pulmonary disease, unspecified: Secondary | ICD-10-CM | POA: Insufficient documentation

## 2011-04-18 DIAGNOSIS — Z8546 Personal history of malignant neoplasm of prostate: Secondary | ICD-10-CM | POA: Insufficient documentation

## 2011-04-18 DIAGNOSIS — R42 Dizziness and giddiness: Secondary | ICD-10-CM

## 2011-04-18 DIAGNOSIS — E119 Type 2 diabetes mellitus without complications: Secondary | ICD-10-CM | POA: Insufficient documentation

## 2011-04-18 DIAGNOSIS — I491 Atrial premature depolarization: Secondary | ICD-10-CM | POA: Insufficient documentation

## 2011-04-18 DIAGNOSIS — F17200 Nicotine dependence, unspecified, uncomplicated: Secondary | ICD-10-CM | POA: Insufficient documentation

## 2011-04-18 DIAGNOSIS — I10 Essential (primary) hypertension: Secondary | ICD-10-CM | POA: Insufficient documentation

## 2011-04-18 LAB — CBC
HCT: 39.5 % (ref 39.0–52.0)
MCV: 90.6 fL (ref 78.0–100.0)
Platelets: 176 10*3/uL (ref 150–400)
RBC: 4.36 MIL/uL (ref 4.22–5.81)
WBC: 6.7 10*3/uL (ref 4.0–10.5)

## 2011-04-18 LAB — BASIC METABOLIC PANEL
BUN: 11 mg/dL (ref 6–23)
CO2: 25 mEq/L (ref 19–32)
Chloride: 104 mEq/L (ref 96–112)
Creatinine, Ser: 0.9 mg/dL (ref 0.50–1.35)

## 2011-04-18 LAB — TROPONIN I: Troponin I: <0.3 ng/mL (ref <0.30)

## 2011-04-18 MED ORDER — LORAZEPAM 2 MG/ML IJ SOLN
2.0000 mg | Freq: Once | INTRAMUSCULAR | Status: AC
  Administered 2011-04-18: 2 mg via INTRAVENOUS
  Filled 2011-04-18: qty 1

## 2011-04-18 MED ORDER — MIDAZOLAM HCL 2 MG/2ML IJ SOLN: 1.0000 mg | Freq: Once | INTRAMUSCULAR | Status: DC

## 2011-04-18 NOTE — ED Notes (Signed)
Pt requesting something for MRI to relax EDMD aware

## 2011-04-18 NOTE — ED Notes (Signed)
Returned from MRI 

## 2011-04-18 NOTE — ED Provider Notes (Signed)
History     CSN: [id number]  Arrival date & time 04/18/11  1228   First MD Initiated Contact with Patient 04/18/11 1240      Chief Complaint  Patient presents with  . Near Syncope     .pt stated that he had become weak and dizzy  during PT theraphy denies loc ems was called    (Consider location/radiation/quality/duration/timing/severity/associated sxs/prior treatment) The history is provided by the patient, medical records and the spouse.   patient has had recent left shoulder surgery and he was at rehabilitation today when he acutely became lightheaded and generally weak and had an episode of near-syncope.  He denies syncope.  He denied chest pain or palpitations.  He denied unilateral arm or leg weakness.  There is no reported slurred speech.  He has no prior history of stroke although I he has been worked up for TIA before in the past.  At this time he reports he feels much better when he was started at the bedside he still reports that he is slightly "off balance".  He's had gait instability for some time now including a recent admission at the end of January 2013 for generalized weakness and gait instability without cause found at that time.  The patient wants to know why he continues having difficulty ambulating at times the.  He is turning 78 years old tomorrow.  He denies recent fevers or chills.  He's been eating and drinking normally.  He denies melena hematochezia.  His had no diarrhea.  He has had no recent changes in his medications.  Past Medical History  Diagnosis Date  . Hypertension   . PAC (premature atrial contraction)   . PVC's (premature ventricular contractions)   . Tobacco dependence   . Diabetes mellitus   . Hyperlipidemia   . Prostate cancer   . Osteoarthritis   . Peripheral neuropathy   . Complication of anesthesia     ?, became limp after surgery -was hospitalized  . COPD (chronic obstructive pulmonary disease)     Past Surgical History  Procedure Date   . Replacement total knee   . Total shoulder replacement   . Cholecystectomy   . Cardiovascular stress test 03/27/2007    EF 51%    Family History  Problem Relation Age of Onset  . COPD Mother   . Stroke Father     History  Substance Use Topics  . Smoking status: Current Everyday Smoker -- 1.5 packs/day for 65 years  . Smokeless tobacco: Never Used  . Alcohol Use: No      Review of Systems  All other systems reviewed and are negative.    Allergies  Ciprofloxacin; Tramadol; and Codeine  Home Medications   Current Outpatient Rx  Name Route Sig Dispense Refill  . AMLODIPINE BESYLATE 10 MG PO TABS Oral Take 10 mg by mouth daily.      . ASPIRIN EC 81 MG PO TBEC Oral Take 81 mg by mouth daily.    . B COMPLEX PO TABS Oral Take 1 tablet by mouth daily.      . BUDESONIDE-FORMOTEROL FUMARATE 160-4.5 MCG/ACT IN AERO Inhalation Inhale 2 puffs into the lungs 2 (two) times daily. 1 Inhaler 12  . VITAMIN D 1000 UNITS PO TABS Oral Take 2,000 Units by mouth 3 (three) times daily.    Marland Kitchen CLONAZEPAM 0.5 MG PO TABS Oral Take 0.5 mg by mouth 3 (three) times daily as needed. For anxiety    . OMEGA-3 FATTY ACIDS  1000 MG PO CAPS Oral Take 1 g by mouth daily.      Marland Kitchen GABAPENTIN 600 MG PO TABS Oral Take 600 mg by mouth 4 (four) times daily.    Marland Kitchen LOSARTAN POTASSIUM 100 MG PO TABS Oral Take 1 tablet by mouth At bedtime.    Marland Kitchen METFORMIN HCL 500 MG PO TABS Oral Take 1,000 mg by mouth 2 (two) times daily with a meal.    . METOPROLOL SUCCINATE ER 50 MG PO TB24 Oral Take 50 mg by mouth daily. Take with or immediately following a meal.    . OVER THE COUNTER MEDICATION Oral Take 2 tablets by mouth 2 (two) times daily. Formula 303. Muscle relaxer.    Marland Kitchen SIMVASTATIN 20 MG PO TABS Oral Take 20 mg by mouth at bedtime.      Marland Kitchen TIOTROPIUM BROMIDE MONOHYDRATE 18 MCG IN CAPS Inhalation Place 1 capsule (18 mcg total) into inhaler and inhale daily. 30 capsule 0  . VITAMIN E 400 UNITS PO CAPS Oral Take 400 Units by  mouth daily.      . ALBUTEROL SULFATE HFA 108 (90 BASE) MCG/ACT IN AERS Inhalation Inhale 2 puffs into the lungs every 4 (four) hours as needed for wheezing or shortness of breath. 1 Inhaler 0    BP 130/81  Pulse 69  Temp(Src) 97.8 F (36.6 C) (Oral)  Resp 19  SpO2 97%  Physical Exam  Nursing note and vitals reviewed. Constitutional: He is oriented to person, place, and time. He appears well-developed and well-nourished.  HENT:  Head: Normocephalic and atraumatic.  Eyes: Pupils are equal, round, and reactive to light.  Cardiovascular: Regular rhythm.   Pulmonary/Chest: Effort normal.  Abdominal: Soft.  Neurological: He is alert and oriented to person, place, and time.       5/5 strength in major muscle groups of  bilateral upper and lower extremities. Speech normal. No facial asymetry.  Normal finger to nose bilaterally.  Normal heel-to-shin bilaterally.  Patient with inability to stand with his eyes closed at the bedside.     ED Course  Procedures (including critical care time)  Labs Reviewed  BASIC METABOLIC PANEL - Abnormal; Notable for the following:    Glucose, Bld 138 (*)    GFR calc non Af Amer 78 (*)    All other components within normal limits  CBC  TROPONIN I   Mr Brain Wo Contrast  04/18/2011  *RADIOLOGY REPORT*  Clinical Data: 78 year old male with dizziness, lower extremity weakness, elevated blood pressure, recent shoulder surgery.  MRI HEAD WITHOUT CONTRAST  Technique:  Multiplanar, multiecho pulse sequences of the brain and surrounding structures were obtained according to standard protocol without intravenous contrast.  Comparison: 06/01/2008.  Findings: No restricted diffusion to suggest acute infarction.  No midline shift, mass effect, evidence of mass lesion, ventriculomegaly, extra-axial collection or acute intracranial hemorrhage.  Cervicomedullary junction and pituitary are within normal limits.  Major intracranial vascular flow voids are preserved.  Chronic  widespread cerebral white matter T2 and FLAIR hyperintensity.  Comparatively mild bilateral deep gray matter nuclei T2 heterogeneity.  Brainstem and cerebellum are relatively spared.  Negative for age visualized cervical spine.  No cortical encephalomalacia identified.  Visualized bone marrow signal is within normal limits.  Visualized orbit soft tissues are within normal limits.  Mild left maxillary sinus mucosal thickening.  Other Visualized paranasal sinuses and mastoids are clear.  12 mm posterior inferior parotid gland nodule on the right (series 3 image 3, series 9 image 14).  Otherwise negative scalp soft tissues.  IMPRESSION: 1. No acute intracranial abnormality.  Chronically advanced nonspecific white matter disease. 2.  Small posterior inferior right parotid gland nodule is nonspecific.  Non-urgent ENT follow-up recommended.  Original Report Authenticated By: Harley Hallmark, M.D.     1. Dizziness       MDM  The patient's difficulty to stand the bedside as could represent cerebellar stroke however an MRI was obtained demonstrating no evidence of stroke.  I suspect this is the patient's continued gait instability.  His EKG and labs are normal.  I doubt this is cardiac in nature.  The patient will be referred to neurology.  At this time, to determine if the patient will be up to be discharged home safely.  A walker may help at home  4:02 PM The patient has a walker at home.  He feels better at this time.  He agreeable to outpatient plan.  He reports he'll be fine at home.  He's been given neurology followup.  His MRI scan is without acute pathology.  He was told he will need to followup with ear nose and throat Re: small posterior inferior right parotid gland nodule  Lyanne Co, MD 04/18/11 [phone]

## 2011-04-18 NOTE — ED Notes (Signed)
Patient transported to MRI 

## 2011-04-18 NOTE — ED Notes (Signed)
WUJ:[id number]<JY> Expected date:04/18/11<BR> Expected time:<BR> Means of arrival:Ambulance<BR> Comments:<BR> EMS 43 GC, 83 yom near syncope while at PT

## 2011-04-29 ENCOUNTER — Telehealth: Payer: Self-pay | Admitting: Cardiology

## 2011-04-29 DIAGNOSIS — I10 Essential (primary) hypertension: Secondary | ICD-10-CM

## 2011-04-29 MED ORDER — HYDROCHLOROTHIAZIDE 25 MG PO TABS: ORAL_TABLET | ORAL | Status: DC

## 2011-04-29 NOTE — Telephone Encounter (Signed)
New problem Pt's wife called and said that his BP is ranging from 135/72 to 159/103. Please call her back

## 2011-04-29 NOTE — Telephone Encounter (Signed)
Patient's wife called stating patient's blood pressure has been elevated,ranging 135/72 to 159/103.Spoke to Norma Fredrickson NP she advised to restart HCTZ 25 mg 1/2 tablet daily and have bmet in 2 weeks.

## 2011-04-30 ENCOUNTER — Telehealth: Payer: Self-pay | Admitting: Cardiology

## 2011-04-30 NOTE — Telephone Encounter (Signed)
New problem:  Per Lonnie Barron- physical therapy at [facility]. Patient at office now. B/p 149/100. yesterday 164/106. No exercise. EMS was called on 2/21.

## 2011-04-30 NOTE — Telephone Encounter (Signed)
Nikki from [employer] called stating patient's B/P elevated 149/100.States cannot have PT with B/P elevated.Advised HCTZ was just restarted yesterday 04/29/11.Spoke with Norma Fredrickson NP she advised to increase HCTZ to 25 mg daily.Patient wants appointment to be checked.Appointment scheduled with Norma Fredrickson NP 05/07/11 at 10;30 am.

## 2011-05-07 ENCOUNTER — Ambulatory Visit: Payer: Medicare Other | Admitting: Nurse Practitioner

## 2011-05-08 ENCOUNTER — Encounter: Payer: Self-pay | Admitting: Nurse Practitioner

## 2011-05-08 ENCOUNTER — Ambulatory Visit (INDEPENDENT_AMBULATORY_CARE_PROVIDER_SITE_OTHER): Payer: Medicare Other | Admitting: Nurse Practitioner

## 2011-05-08 VITALS — BP 118/58 | HR 97 | Ht 74.0 in | Wt 231.0 lb

## 2011-05-08 DIAGNOSIS — R55 Syncope and collapse: Secondary | ICD-10-CM | POA: Insufficient documentation

## 2011-05-08 DIAGNOSIS — K118 Other diseases of salivary glands: Secondary | ICD-10-CM | POA: Insufficient documentation

## 2011-05-08 DIAGNOSIS — F17200 Nicotine dependence, unspecified, uncomplicated: Secondary | ICD-10-CM

## 2011-05-08 DIAGNOSIS — I10 Essential (primary) hypertension: Secondary | ICD-10-CM

## 2011-05-08 NOTE — Assessment & Plan Note (Signed)
He is not interested in stopping.

## 2011-05-08 NOTE — Assessment & Plan Note (Signed)
His blood pressures from home showed some lability last month, but now seem to have normalized. I have left him on his current regimen.

## 2011-05-08 NOTE — Patient Instructions (Signed)
We are going to put a heart monitor on for the next month.  We are going to arrange for a stress test.  Stay on your current medicines.   Keep your appointment with the neurologist for tomorrow.  We will see you back in about 5 weeks with Dr. Swaziland.

## 2011-05-08 NOTE — Progress Notes (Addendum)
Lonnie Barron Date of Birth: 04/16/1928 Medical Record #[id number]  History of Present Illness: Lonnie Barron is seen today for a work in visit. He is seen for Lonnie Barron. He has ongoing tobacco abuse, a history of OA, HTN, DM, HLD and neuropathy. Last nuclear was in 2009. He is here with his wife today. His last visit with Lonnie Barron was back in September. He has been in the hospital with a COPD exacerbation in January. He was also in the ER last month with "collapsing spells". He notes that his whole body "just goes out". It is not clear if he actually loses consciousness. This does not occur at rest and usually happens when he is upright. His spell last month occurred while undergoing some physical therapy for his shoulder. He had a negative ER evaluation with a negative EKG. MRI was negative for acute stroke. Did have a nodule on the right parotid with suggested ENT follow up. Patient was not aware of this finding. Had been having a lot of lability in his blood pressures, but this seems to be settling down. He is to see a neurologist tomorrow in [place]. He reports 5 to 10 of these "collapse spells". He has cut back on his driving. His wife notes more issues with his memory and he does ask repetitive questions. No chest pain reported. Not sure if he has palpitations. Has very little warning before the spell. He does continue to smoke. His other risk factors include HTN, HLD and diabetes.   Current Outpatient Prescriptions on File Prior to Visit  Medication Sig Dispense Refill  . albuterol (PROVENTIL HFA;VENTOLIN HFA) 108 (90 BASE) MCG/ACT inhaler Inhale 2 puffs into the lungs every 4 (four) hours as needed for wheezing or shortness of breath.  1 Inhaler  0  . amLODipine (NORVASC) 10 MG tablet Take 10 mg by mouth daily.        Marland Kitchen aspirin EC 81 MG tablet Take 81 mg by mouth daily.      . budesonide-formoterol (SYMBICORT) 160-4.5 MCG/ACT inhaler Inhale 2 puffs into the lungs 2 (two) times daily.  1  Inhaler  12  . cholecalciferol (VITAMIN D) 1000 UNITS tablet Take 2,000 Units by mouth 3 (three) times daily.      . clonazePAM (KLONOPIN) 0.5 MG tablet Take 0.5 mg by mouth 3 (three) times daily as needed. For anxiety      . Cyanocobalamin (VITAMIN B12 PO) Take by mouth daily.      . fish oil-omega-3 fatty acids 1000 MG capsule Take 1 g by mouth daily.        Marland Kitchen gabapentin (NEURONTIN) 600 MG tablet Take 600 mg by mouth 4 (four) times daily.      Marland Kitchen losartan (COZAAR) 100 MG tablet Take 1 tablet by mouth At bedtime.      . metoprolol succinate (TOPROL-XL) 50 MG 24 hr tablet Take 50 mg by mouth daily. Take with or immediately following a meal.      . OVER THE COUNTER MEDICATION Take 2 tablets by mouth 2 (two) times daily. Formula 303. Muscle relaxer.      . simvastatin (ZOCOR) 20 MG tablet Take 20 mg by mouth at bedtime.        Marland Kitchen tiotropium (SPIRIVA) 18 MCG inhalation capsule Place 1 capsule (18 mcg total) into inhaler and inhale daily.  30 capsule  0  . vitamin E 400 UNIT capsule Take 400 Units by mouth daily.        Marland Kitchen  DISCONTD: hydrochlorothiazide (HYDRODIURIL) 25 MG tablet Take 1/2 tablet daily  30 tablet  6  . DISCONTD: metFORMIN (GLUCOPHAGE) 500 MG tablet Take 1,000 mg by mouth 2 (two) times daily with a meal.        Allergies  Allergen Reactions  . Ciprofloxacin Hives, Shortness Of Breath and Itching  . Tramadol Other (See Comments)    unknown  . Codeine Anxiety    Past Medical History  Diagnosis Date  . Hypertension   . PAC (premature atrial contraction)   . PVC's (premature ventricular contractions)   . Tobacco dependence   . Diabetes mellitus   . Hyperlipidemia   . Prostate cancer   . Osteoarthritis   . Peripheral neuropathy   . Complication of anesthesia     ?, became limp after surgery -was hospitalized  . COPD (chronic obstructive pulmonary disease)     Past Surgical History  Procedure Date  . Replacement total knee   . Total shoulder replacement   .  Cholecystectomy   . Cardiovascular stress test 03/27/2007    EF 51%    History  Smoking status  . Current Everyday Smoker -- 1.5 packs/day for 65 years  Smokeless tobacco  . Never Used    History  Alcohol Use No    Family History  Problem Relation Age of Onset  . COPD Mother   . Stroke Father     Review of Systems: The review of systems is per the HPI.  All other systems were reviewed and are negative.  Physical Exam: BP 118/58  Pulse 97  Ht 6\' 2"  (1.88 m)  Wt 231 lb (104.781 kg)  BMI 29.66 kg/m2  SpO2 95% Patient is very pleasant and in no acute distress. He is a rather large man. Smells of tobacco. Skin is warm and dry. Color is normal.  HEENT is unremarkable. Normocephalic/atraumatic. PERRL. Sclera are nonicteric. Neck is supple. No masses. No JVD. Lungs are clear. Cardiac exam shows a regular rate and rhythm. Abdomen is soft. Extremities are without edema. Gait and ROM are intact. No gross neurologic deficits noted.  LABORATORY DATA: [name] Contrast  04/18/2011  *RADIOLOGY REPORT*  Clinical Data: 78 year old male with dizziness, lower extremity weakness, elevated blood pressure, recent shoulder surgery.  MRI HEAD WITHOUT CONTRAST  Technique:  Multiplanar, multiecho pulse sequences of the brain and surrounding structures were obtained according to standard protocol without intravenous contrast.  Comparison: 06/01/2008.  Findings: No restricted diffusion to suggest acute infarction.  No midline shift, mass effect, evidence of mass lesion, ventriculomegaly, extra-axial collection or acute intracranial hemorrhage.  Cervicomedullary junction and pituitary are within normal limits.  Major intracranial vascular flow voids are preserved.  Chronic widespread cerebral white matter T2 and FLAIR hyperintensity.  Comparatively mild bilateral deep gray matter nuclei T2 heterogeneity.  Brainstem and cerebellum are relatively spared.  Negative for age visualized cervical spine.  No  cortical encephalomalacia identified.  Visualized bone marrow signal is within normal limits.  Visualized orbit soft tissues are within normal limits.  Mild left maxillary sinus mucosal thickening.  Other Visualized paranasal sinuses and mastoids are clear.  12 mm posterior inferior parotid gland nodule on the right (series 3 image 3, series 9 image 14).  Otherwise negative scalp soft tissues.  IMPRESSION: 1. No acute intracranial abnormality.  Chronically advanced nonspecific white matter disease. 2.  Small posterior inferior right parotid gland nodule is nonspecific.  Non-urgent ENT follow-up recommended.  Original Report Authenticated By: Harley Hallmark, M.D.  Assessment / Plan:

## 2011-05-08 NOTE — Assessment & Plan Note (Signed)
This was noted on recent MRI scanning. Will make arrangements to see ENT. He is currently without a PCP.

## 2011-05-08 NOTE — Assessment & Plan Note (Signed)
He reports multiple episodes. Not clear as to whether he has actual loss of consciousness. He is seeing Neurology tomorrow in [place]. We will place an event monitor and update his stress test. He does have multiple CV risk factors which could be contributing. We will see him back in about 5 weeks, sooner if the studies would dictate otherwise. Patient is agreeable to this plan and will call if any problems develop in the interim.

## 2011-05-09 DIAGNOSIS — R55 Syncope and collapse: Secondary | ICD-10-CM

## 2011-05-13 ENCOUNTER — Other Ambulatory Visit (INDEPENDENT_AMBULATORY_CARE_PROVIDER_SITE_OTHER): Payer: Medicare Other

## 2011-05-13 ENCOUNTER — Ambulatory Visit (HOSPITAL_COMMUNITY): Payer: Medicare Other | Attending: Cardiology | Admitting: Radiology

## 2011-05-13 VITALS — BP 125/58 | Ht 74.0 in | Wt 230.0 lb

## 2011-05-13 DIAGNOSIS — R0609 Other forms of dyspnea: Secondary | ICD-10-CM | POA: Insufficient documentation

## 2011-05-13 DIAGNOSIS — I10 Essential (primary) hypertension: Secondary | ICD-10-CM | POA: Insufficient documentation

## 2011-05-13 DIAGNOSIS — I779 Disorder of arteries and arterioles, unspecified: Secondary | ICD-10-CM | POA: Insufficient documentation

## 2011-05-13 DIAGNOSIS — F17200 Nicotine dependence, unspecified, uncomplicated: Secondary | ICD-10-CM | POA: Insufficient documentation

## 2011-05-13 DIAGNOSIS — J449 Chronic obstructive pulmonary disease, unspecified: Secondary | ICD-10-CM | POA: Insufficient documentation

## 2011-05-13 DIAGNOSIS — R0989 Other specified symptoms and signs involving the circulatory and respiratory systems: Secondary | ICD-10-CM | POA: Insufficient documentation

## 2011-05-13 DIAGNOSIS — J4489 Other specified chronic obstructive pulmonary disease: Secondary | ICD-10-CM | POA: Insufficient documentation

## 2011-05-13 DIAGNOSIS — R5381 Other malaise: Secondary | ICD-10-CM | POA: Insufficient documentation

## 2011-05-13 DIAGNOSIS — R5383 Other fatigue: Secondary | ICD-10-CM | POA: Insufficient documentation

## 2011-05-13 DIAGNOSIS — E785 Hyperlipidemia, unspecified: Secondary | ICD-10-CM | POA: Insufficient documentation

## 2011-05-13 DIAGNOSIS — E119 Type 2 diabetes mellitus without complications: Secondary | ICD-10-CM | POA: Insufficient documentation

## 2011-05-13 DIAGNOSIS — R0602 Shortness of breath: Secondary | ICD-10-CM

## 2011-05-13 DIAGNOSIS — R55 Syncope and collapse: Secondary | ICD-10-CM

## 2011-05-13 LAB — BASIC METABOLIC PANEL
BUN: 17 mg/dL (ref 6–23)
CO2: 28 mEq/L (ref 19–32)
Calcium: 9.6 mg/dL (ref 8.4–10.5)
Chloride: 104 mEq/L (ref 96–112)
Creatinine, Ser: 1.1 mg/dL (ref 0.4–1.5)
GFR: 66.87 mL/min (ref >60.00)
Glucose, Bld: 141 mg/dL — ABNORMAL HIGH (ref 70–99)
Potassium: 4.1 mEq/L (ref 3.5–5.1)
Sodium: 140 mEq/L (ref 135–145)

## 2011-05-13 MED ORDER — TECHNETIUM TC 99M TETROFOSMIN IV KIT
11.0000 | PACK | Freq: Once | INTRAVENOUS | Status: AC | PRN
  Administered 2011-05-13: 11 via INTRAVENOUS

## 2011-05-13 MED ORDER — TECHNETIUM TC 99M TETROFOSMIN IV KIT
33.0000 | PACK | Freq: Once | INTRAVENOUS | Status: AC | PRN
  Administered 2011-05-13: 33 via INTRAVENOUS

## 2011-05-13 MED ORDER — REGADENOSON 0.4 MG/5ML IV SOLN
0.4000 mg | Freq: Once | INTRAVENOUS | Status: AC
  Administered 2011-05-13: 0.4 mg via INTRAVENOUS

## 2011-05-13 NOTE — Progress Notes (Signed)
[facility] SITE 3 NUCLEAR MED [address] [place] [address] [phone]  Cardiology Nuclear Med Study  Lonnie Barron is a 78 y.o. male     MRN : [id number]     DOB: 11-09-1928  Procedure Date: 05/13/2011  Nuclear Med Background Indication for Stress Test:  Evaluation for Ischemia History:  COPD and '09 [id number]:[id number], EF=51%; H/O Chemo Cardiac Risk Factors: Carotid Disease, Hypertension, Lipids, NIDDM and Smoker  Symptoms:  DOE, Fatigue, Near Syncope and Syncope   Nuclear Pre-Procedure Caffeine/Decaff Intake:  None> 12 hrs NPO After: 7:00pm   Lungs:  clear O2 Sat: 96% on room air. IV 0.9% NS with Angio Cath:  20g  IV Site: R Antecubital x 1, tolerated well IV Started by:  Irean Hong, RN  Chest Size (in):  48 Cup Size: n/a  Height: 6\' 2"  (1.88 m)  Weight:  230 lb (104.327 kg)  BMI:  Body mass index is 29.53 kg/(m^2). Tech Comments:  Held metformin, and toprol this am    Nuclear Med Study 1 or 2 day study: 1 day  Stress Test Type:  Lexiscan  Reading MD: Olga Millers, MD  Order Authorizing Provider:  Peter Swaziland, MD  Resting Radionuclide: Technetium 80m Tetrofosmin  Resting Radionuclide Dose: 10.8 mCi   Stress Radionuclide:  Technetium 29m Tetrofosmin  Stress Radionuclide Dose: 33.0 mCi           Stress Protocol Rest HR: 65 Stress HR: 78  Rest BP: 125/58 Stress BP: 140/60  Exercise Time (min): n/a METS: n/a   Predicted Max HR: 139 bpm % Max HR: 56.12 bpm Rate Pressure Product: 46962   Dose of Adenosine (mg):  n/a Dose of Lexiscan: 0.4 mg  Dose of Atropine (mg): n/a Dose of Dobutamine: n/a   Stress Test Technologist: Smiley Houseman, CMA-N  Nuclear Technologist:  Domenic Polite, CNMT     Rest Procedure:  Myocardial perfusion imaging was performed at rest 45 minutes following the intravenous administration of Technetium 75m Tetrofosmin.  Rest ECG: No acute changes.  Stress Procedure:  The patient received IV Lexiscan 0.4 mg over  15-seconds.  Technetium 74m Tetrofosmin injected at 30-seconds.  There were no significant changes with Lexiscan, occasional PVC's and PAC's were noted.  Quantitative spect images were obtained after a 45 minute delay.  Stress ECG: No significant ST segment change suggestive of ischemia.  QPS Raw Data Images:  Acquisition technically good; normal left ventricular size. Stress Images:  There is decreased uptake in the apex. Rest Images:  There is decreased uptake in the apex, slightly less prominent compared to the stress images. Subtraction (SDS):  No evidence of ischemia; minimal reversibility felt most likely not to be significant. Transient Ischemic Dilatation (Normal <1.22):  1.11 Lung/Heart Ratio (Normal <0.45):  0.26  Quantitative Gated Spect Images QGS EDV:  117 ml QGS ESV:  57 ml  Impression Exercise Capacity:  Lexiscan with no exercise. BP Response:  Normal blood pressure response. Clinical Symptoms:  No chest pain or SOB. ECG Impression:  No significant ST segment change suggestive of ischemia. Comparison with Prior Nuclear Study: No significant change from previous study  Overall Impression:  Normal stress nuclear study with a small, mild, fixed apical defect consistent with apical thinning but no ischemia.  LV Ejection Fraction: 52%.  LV Wall Motion:  NL LV Function; NL Wall Motion   .Olga Millers

## 2011-05-14 ENCOUNTER — Other Ambulatory Visit: Payer: Medicare Other

## 2011-05-14 ENCOUNTER — Telehealth: Payer: Self-pay | Admitting: Cardiology

## 2011-05-14 NOTE — Telephone Encounter (Signed)
New Msg: Robin from [facility] calling to speak with nurse regard pt reports. Please return call to discuss further.

## 2011-05-14 NOTE — Telephone Encounter (Signed)
Robin from [employer] called stating she faxed monitor strips on patient showing freq PVCs.Advised will show to Dr.Jordan.

## 2011-05-20 ENCOUNTER — Telehealth: Payer: Self-pay | Admitting: Cardiology

## 2011-05-20 NOTE — Telephone Encounter (Signed)
Patient called, note saying ok to return to physical therapy signed by Norma Fredrickson NP faxed to [facility] [phone].

## 2011-05-20 NOTE — Telephone Encounter (Signed)
Pt needs ok from dr Swaziland to continue therapy on his shoulder, Attn: nicky rogers fax [phone] , pt requesting call [phone]

## 2011-05-27 ENCOUNTER — Other Ambulatory Visit: Payer: Self-pay

## 2011-05-27 ENCOUNTER — Telehealth: Payer: Self-pay | Admitting: Cardiology

## 2011-05-27 MED ORDER — HYDROCHLOROTHIAZIDE 25 MG PO TABS: 25.0000 mg | ORAL_TABLET | Freq: Every day | ORAL | Status: DC

## 2011-05-27 NOTE — Telephone Encounter (Signed)
Per pharm there has been some changes to pt's hydrochlorothiazide 25mg  and they need a new order

## 2011-06-10 ENCOUNTER — Encounter: Payer: Self-pay | Admitting: Cardiology

## 2011-06-13 ENCOUNTER — Encounter: Payer: Self-pay | Admitting: Cardiology

## 2011-06-13 ENCOUNTER — Ambulatory Visit (INDEPENDENT_AMBULATORY_CARE_PROVIDER_SITE_OTHER): Payer: Medicare Other | Admitting: Cardiology

## 2011-06-13 VITALS — BP 128/66 | HR 83 | Ht 74.0 in | Wt 231.2 lb

## 2011-06-13 DIAGNOSIS — F17200 Nicotine dependence, unspecified, uncomplicated: Secondary | ICD-10-CM

## 2011-06-13 DIAGNOSIS — R55 Syncope and collapse: Secondary | ICD-10-CM

## 2011-06-13 DIAGNOSIS — I10 Essential (primary) hypertension: Secondary | ICD-10-CM

## 2011-06-13 DIAGNOSIS — I493 Ventricular premature depolarization: Secondary | ICD-10-CM

## 2011-06-13 DIAGNOSIS — Z720 Tobacco use: Secondary | ICD-10-CM

## 2011-06-13 DIAGNOSIS — I4949 Other premature depolarization: Secondary | ICD-10-CM

## 2011-06-13 NOTE — Patient Instructions (Signed)
Reduce amlodipine to 5 mg daily.  Reduce Vit D to 1000 units daily  We will request the records from [facility].

## 2011-06-13 NOTE — Progress Notes (Signed)
Lonnie Barron Date of Birth: 1928-08-21 Medical Record #[id number]  History of Present Illness: Lonnie Barron is seen today  for a followup visit. He continues to have "spells". Previously he would suddenly become weak and fall. It was not clear that he loss consciousness. He reports yesterday he had a more protracted episode. He awoke with pain in his left neck. He had severe gait instability and had to use a walker. He nearly fell several times. These symptoms lasted for about 4 or 5 hours before resolving. He had mild lightheadedness. He reports he has been seeing a chiropractor for chronic neck problems. He has had extensive evaluation with a neurologist in [place] including carotid Dopplers, EEGs, and tilt table testing. I do not have these results. He does note that when his blood pressures more like it is today he feels much weaker. He has worn an event monitor for the past month which demonstrated occasional PVCs but no significant arrhythmias. Previous nuclear stress testing was normal.  Current Outpatient Prescriptions on File Prior to Visit  Medication Sig Dispense Refill  . albuterol (PROVENTIL HFA;VENTOLIN HFA) 108 (90 BASE) MCG/ACT inhaler Inhale 2 puffs into the lungs every 4 (four) hours as needed for wheezing or shortness of breath.  1 Inhaler  0  . amLODipine (NORVASC) 10 MG tablet Take 10 mg by mouth daily.        Marland Kitchen aspirin EC 81 MG tablet Take 81 mg by mouth daily.      . cholecalciferol (VITAMIN D) 1000 UNITS tablet Take 2,000 Units by mouth 3 (three) times daily.      . clonazePAM (KLONOPIN) 0.5 MG tablet Take 0.5 mg by mouth as directed. For anxiety      . Cyanocobalamin (VITAMIN B12 PO) Take by mouth daily.      . fish oil-omega-3 fatty acids 1000 MG capsule Take 3,000 mg by mouth daily.       Marland Kitchen gabapentin (NEURONTIN) 600 MG tablet Take 600 mg by mouth 4 (four) times daily.      . hydrochlorothiazide (HYDRODIURIL) 25 MG tablet Take 1 tablet (25 mg total) by mouth  daily.  30 tablet  11  . losartan (COZAAR) 100 MG tablet Take 1 tablet by mouth At bedtime.      . metFORMIN (GLUCOPHAGE) 1000 MG tablet Take 1,000 mg by mouth 2 (two) times daily with a meal.      . metoprolol succinate (TOPROL-XL) 50 MG 24 hr tablet Take 50 mg by mouth daily. Take with or immediately following a meal.      . OVER THE COUNTER MEDICATION Take 2 tablets by mouth 2 (two) times daily. Formula 303. Muscle relaxer.      . simvastatin (ZOCOR) 20 MG tablet Take 20 mg by mouth at bedtime.        Marland Kitchen tiotropium (SPIRIVA) 18 MCG inhalation capsule Place 1 capsule (18 mcg total) into inhaler and inhale daily.  30 capsule  0  . vitamin E 400 UNIT capsule Take 400 Units by mouth daily.          Allergies  Allergen Reactions  . Ciprofloxacin Hives, Shortness Of Breath and Itching  . Tramadol Other (See Comments)    unknown  . Codeine Anxiety    Past Medical History  Diagnosis Date  . Hypertension   . PAC (premature atrial contraction)   . PVC's (premature ventricular contractions)   . Tobacco dependence   . Diabetes mellitus   . Hyperlipidemia   .  Prostate cancer   . Osteoarthritis   . Peripheral neuropathy   . Complication of anesthesia     ?, became limp after surgery -was hospitalized  . COPD (chronic obstructive pulmonary disease)     Past Surgical History  Procedure Date  . Replacement total knee   . Total shoulder replacement   . Cholecystectomy   . Cardiovascular stress test 03/27/2007    EF 51%    History  Smoking status  . Current Everyday Smoker -- 1.5 packs/day for 65 years  Smokeless tobacco  . Never Used    History  Alcohol Use No    Family History  Problem Relation Age of Onset  . COPD Mother   . Stroke Father     Review of Systems: The review of systems is per the HPI.  All other systems were reviewed and are negative.  Physical Exam: BP 128/66  Pulse 83  Ht 6\' 2"  (1.88 m)  Wt 231 lb 3.2 oz (104.872 kg)  BMI 29.68 kg/m2 Patient is  very pleasant and in no acute distress.  Skin is warm and dry. Color is normal.  HEENT is unremarkable. Normocephalic/atraumatic. PERRL. Sclera are nonicteric. Neck is supple. No masses. No JVD. Lungs are clear. Cardiac exam shows a regular rate and rhythm. Abdomen is soft. Extremities are without edema. Gait and ROM are intact. No gross neurologic deficits noted.  LABORATORY DATA: Event monitor demonstrated normal sinus rhythm with occasional PVCs and PVC couplets. These were not associated with any symptoms. Nuclear stress testing showed a small fixed apical defect consistent with apical thinning but no ischemia. Ejection fraction 52%.   Assessment / Plan:

## 2011-06-13 NOTE — Assessment & Plan Note (Signed)
It is not clear that he has true syncope. He does get weak and falls easily. We will need to obtain copies of his records from [place]. At this point I see no cardiac cause for his symptoms. Since he does feel weaker when his blood pressure is lower reduce his amlodipine to 5 mg daily to see if this will help. I will followup again in 3 months.

## 2011-06-13 NOTE — Assessment & Plan Note (Signed)
These appear to be chronic and asymptomatic. He has normal LV function. Continue beta blocker therapy.

## 2011-06-20 ENCOUNTER — Telehealth: Payer: Self-pay | Admitting: Nurse Practitioner

## 2011-06-20 ENCOUNTER — Telehealth: Payer: Self-pay | Admitting: Cardiology

## 2011-06-20 NOTE — Telephone Encounter (Signed)
Records Rec From [facility] gave to [facility]  06/20/11/KM

## 2011-06-20 NOTE — Telephone Encounter (Signed)
ROI faxed over to [facility] @ [phone] 06/20/11/Km

## 2011-06-20 NOTE — Telephone Encounter (Signed)
New Problem:     Patient's wife called in to give the Dr. Chestine Spore Pinyan's office fax#[phone], to request the results of her husband's neurology EEG and neck ultra sound. Please call back if you have any questions.

## 2011-06-20 NOTE — Telephone Encounter (Signed)
Patient's wife called no answer.LMTC. 

## 2011-06-21 ENCOUNTER — Telehealth: Payer: Self-pay | Admitting: *Deleted

## 2011-06-21 NOTE — Telephone Encounter (Signed)
Follow-up:     Patient called returning Cheryl's phone call.  Please call back.

## 2011-06-21 NOTE — Telephone Encounter (Signed)
Phoned pt to let her know we had received tilt table results, and she informed me that the carotid results were now also available--i caaled [facility] and LM for mediacal records to call me or fax carotid doppler results to our number--atten:  Dr jordan--pt agrees--nt

## 2011-06-26 NOTE — Telephone Encounter (Signed)
Patient's wife called was told never received carotid doppler report from [facility].Wife told me to call Ian Malkin [phone] to get report.

## 2011-07-02 ENCOUNTER — Telehealth: Payer: Self-pay | Admitting: Cardiology

## 2011-07-02 MED ORDER — AMLODIPINE BESYLATE 5 MG PO TABS: 5.0000 mg | ORAL_TABLET | Freq: Every day | ORAL | Status: AC

## 2011-07-02 NOTE — Telephone Encounter (Signed)
Spoke to patient's wife amlodipine 5 mg # 30 sent to [facility] in [place].

## 2011-07-02 NOTE — Telephone Encounter (Signed)
Pt was seen on 4/18 and amlodipine was cut in half and they need a Rx written for the 5mg  tabs now because that is working. They use [facility] in [place] phone #[phone]

## 2011-07-02 NOTE — Telephone Encounter (Signed)
F/U   Patient call to verify test results received from [facility] visit with Dr. Orie Rout.  This information was faxed to [facility]  On 5/3.  Please return call if you have not received information, patient  can be reached at [phone].

## 2011-07-02 NOTE — Telephone Encounter (Signed)
Spoke to patient's wife was told never received carotid dopplers from [facility] stated she would call and have carotid doppler report sent to office.

## 2011-07-05 ENCOUNTER — Encounter: Payer: Self-pay | Admitting: Cardiology

## 2011-07-09 NOTE — Telephone Encounter (Signed)
Patient called was told never received carotid doppler report from [facility] he will tell wife and she will have report sent to Dr.Jordan.

## 2011-07-11 ENCOUNTER — Telehealth: Payer: Self-pay

## 2011-07-11 NOTE — Telephone Encounter (Signed)
Patient's wife called was told received patient's carotid dopplers from [facility] that were done 06/17/11.

## 2011-09-19 ENCOUNTER — Ambulatory Visit (INDEPENDENT_AMBULATORY_CARE_PROVIDER_SITE_OTHER): Payer: Medicare Other | Admitting: Cardiology

## 2011-09-19 ENCOUNTER — Encounter: Payer: Self-pay | Admitting: Cardiology

## 2011-09-19 VITALS — BP 142/64 | HR 85 | Ht 74.0 in | Wt 237.4 lb

## 2011-09-19 DIAGNOSIS — I10 Essential (primary) hypertension: Secondary | ICD-10-CM

## 2011-09-19 DIAGNOSIS — F17200 Nicotine dependence, unspecified, uncomplicated: Secondary | ICD-10-CM

## 2011-09-19 DIAGNOSIS — R55 Syncope and collapse: Secondary | ICD-10-CM

## 2011-09-19 NOTE — Patient Instructions (Signed)
Continue your current medication.  I will see you again in 6 months.   

## 2011-09-19 NOTE — Progress Notes (Signed)
Lonnie Barron Date of Birth: 1928-04-21 Medical Record #[id number]  History of Present Illness: Lonnie Barron is seen today  for a followup visit. He has actually done much better since we reduced his Norvasc dose. His symptoms of lightheadedness and dizziness have resolved. He still complains of some visual changes where his eyes seem to jump in and out of focus. He also complains that his peripheral neuropathy does lead to some gait instability. We did receive copies of his records from [facility]. He had carotid Dopplers which showed no significant stenosis. EEG was negative. Tilt table testing did demonstrate mild to modest drop in the blood pressure consistent with orthostasis but no other reflex changes.  Current Outpatient Prescriptions on File Prior to Visit  Medication Sig Dispense Refill  . albuterol (PROVENTIL HFA;VENTOLIN HFA) 108 (90 BASE) MCG/ACT inhaler Inhale 2 puffs into the lungs every 4 (four) hours as needed for wheezing or shortness of breath.  1 Inhaler  0  . amLODipine (NORVASC) 5 MG tablet Take 1 tablet (5 mg total) by mouth daily.  90 tablet  3  . aspirin EC 81 MG tablet Take 81 mg by mouth daily.      . cholecalciferol (VITAMIN D) 1000 UNITS tablet Take 2,000 Units by mouth 3 (three) times daily.      . clonazePAM (KLONOPIN) 0.5 MG tablet Take 0.5 mg by mouth as directed. For anxiety      . Cyanocobalamin (VITAMIN B12 PO) Take by mouth daily.      . fish oil-omega-3 fatty acids 1000 MG capsule Take 620 mg by mouth as directed.       . gabapentin (NEURONTIN) 600 MG tablet Take 600 mg by mouth 4 (four) times daily.      . hydrochlorothiazide (HYDRODIURIL) 25 MG tablet Take 1 tablet (25 mg total) by mouth daily.  30 tablet  11  . losartan (COZAAR) 100 MG tablet Take 1 tablet by mouth At bedtime.      . metFORMIN (GLUCOPHAGE) 1000 MG tablet Take 1,000 mg by mouth 2 (two) times daily with a meal.      . metoprolol succinate (TOPROL-XL) 50 MG 24 hr tablet  Take 50 mg by mouth daily. Take with or immediately following a meal.      . OVER THE COUNTER MEDICATION Take 2 tablets by mouth 2 (two) times daily. Formula 303. Muscle relaxer.      . simvastatin (ZOCOR) 20 MG tablet Take 20 mg by mouth at bedtime.        Marland Kitchen tiotropium (SPIRIVA) 18 MCG inhalation capsule Place 1 capsule (18 mcg total) into inhaler and inhale daily.  30 capsule  0  . vitamin E 400 UNIT capsule Take 400 Units by mouth daily.        Marland Kitchen NITROSTAT 0.4 MG SL tablet         Allergies  Allergen Reactions  . Ciprofloxacin Hives, Shortness Of Breath and Itching  . Tramadol Other (See Comments)    unknown  . Codeine Anxiety    Past Medical History  Diagnosis Date  . Hypertension   . PAC (premature atrial contraction)   . PVC's (premature ventricular contractions)   . Tobacco dependence   . Diabetes mellitus   . Hyperlipidemia   . Prostate cancer   . Osteoarthritis   . Peripheral neuropathy   . Complication of anesthesia     ?, became limp after surgery -was hospitalized  . COPD (chronic obstructive pulmonary disease)  Past Surgical History  Procedure Date  . Replacement total knee   . Total shoulder replacement   . Cholecystectomy   . Cardiovascular stress test 03/27/2007    EF 51%    History  Smoking status  . Current Everyday Smoker -- 1.5 packs/day for 65 years  Smokeless tobacco  . Never Used    History  Alcohol Use No    Family History  Problem Relation Age of Onset  . COPD Mother   . Stroke Father     Review of Systems: The review of systems is per the HPI. He does continue to smoke. All other systems were reviewed and are negative.  Physical Exam: BP 142/64  Pulse 85  Ht 6\' 2"  (1.88 m)  Wt 107.684 kg (237 lb 6.4 oz)  BMI 30.48 kg/m2  SpO2 96% Patient is very pleasant and in no acute distress.  Skin is warm and dry. Color is normal.  HEENT is unremarkable. Normocephalic/atraumatic. PERRL. Sclera are nonicteric. Neck is supple. No  masses. No JVD. Lungs are clear. Cardiac exam shows a regular rate and rhythm. Abdomen is soft. Extremities are without edema. Gait and ROM are intact. No gross neurologic deficits noted.  LABORATORY DATA:    Assessment / Plan:

## 2011-09-19 NOTE — Assessment & Plan Note (Signed)
Blood pressure is fairly well controlled at present. We will continue his current therapy and followup again in 6 months.

## 2011-09-19 NOTE — Assessment & Plan Note (Signed)
I have again counseled him on smoking cessation. He has no intention of quitting now.

## 2011-09-19 NOTE — Assessment & Plan Note (Signed)
There does appear to be an orthostatic component to his prior symptoms of lightheadedness. This has resolved with reduction in his amlodipine dose. He is feeling better now. We will continue with his current antihypertensive therapy.

## 2011-09-24 ENCOUNTER — Encounter: Payer: Self-pay | Admitting: Cardiology

## 2011-11-01 ENCOUNTER — Encounter: Payer: Self-pay | Admitting: Cardiology

## 2012-01-06 ENCOUNTER — Ambulatory Visit
Admission: RE | Admit: 2012-01-06 | Discharge: 2012-01-06 | Disposition: A | Discharge status: Home / Self Care | Payer: Medicare Other | Source: Ambulatory Visit | Attending: Family Medicine | Admitting: Family Medicine

## 2012-01-06 ENCOUNTER — Other Ambulatory Visit: Payer: Self-pay | Admitting: Family Medicine

## 2012-01-06 DIAGNOSIS — R634 Abnormal weight loss: Secondary | ICD-10-CM

## 2012-01-06 DIAGNOSIS — R059 Cough, unspecified: Secondary | ICD-10-CM

## 2012-01-06 DIAGNOSIS — R5383 Other fatigue: Secondary | ICD-10-CM

## 2012-01-06 DIAGNOSIS — R05 Cough: Secondary | ICD-10-CM

## 2012-01-09 ENCOUNTER — Encounter: Payer: Self-pay | Admitting: Cardiology

## 2012-06-05 ENCOUNTER — Telehealth: Payer: Self-pay | Admitting: Family Medicine

## 2012-06-05 ENCOUNTER — Other Ambulatory Visit: Payer: Self-pay | Admitting: Family Medicine

## 2012-06-05 NOTE — Telephone Encounter (Signed)
Ok to refill 

## 2012-06-06 ENCOUNTER — Other Ambulatory Visit: Payer: Self-pay | Admitting: Family Medicine

## 2012-06-06 MED ORDER — TRAMADOL HCL 50 MG PO TABS: 50.0000 mg | ORAL_TABLET | Freq: Four times a day (QID) | ORAL | Status: DC | PRN

## 2012-06-06 NOTE — Telephone Encounter (Signed)
rx refilled.

## 2012-06-07 ENCOUNTER — Other Ambulatory Visit: Payer: Self-pay | Admitting: Family Medicine

## 2012-06-08 ENCOUNTER — Other Ambulatory Visit: Payer: Self-pay | Admitting: Family Medicine

## 2012-06-08 MED ORDER — CLONAZEPAM 0.5 MG PO TABS: 0.5000 mg | ORAL_TABLET | ORAL | Status: DC

## 2012-06-08 NOTE — Telephone Encounter (Signed)
?  ok to refill °

## 2012-06-08 NOTE — Telephone Encounter (Signed)
Called out script to above pharmacy

## 2012-06-08 NOTE — Telephone Encounter (Signed)
Ok to refill with 3 refills.  

## 2012-06-08 NOTE — Telephone Encounter (Signed)
Script called in to pharmacy  

## 2012-06-21 ENCOUNTER — Other Ambulatory Visit: Payer: Self-pay | Admitting: Family Medicine

## 2012-08-11 ENCOUNTER — Other Ambulatory Visit: Payer: Self-pay | Admitting: Family Medicine

## 2012-08-31 ENCOUNTER — Other Ambulatory Visit: Payer: Self-pay | Admitting: Family Medicine

## 2012-09-04 ENCOUNTER — Telehealth: Payer: Self-pay | Admitting: Family Medicine

## 2012-09-04 NOTE — Telephone Encounter (Signed)
Ok to refill with 2 refills 

## 2012-09-04 NOTE — Telephone Encounter (Signed)
?   OK to Refill  

## 2012-09-10 MED ORDER — CLONAZEPAM 0.5 MG PO TABS: 0.5000 mg | ORAL_TABLET | ORAL | Status: DC

## 2012-09-10 NOTE — Telephone Encounter (Signed)
Refill called in. 

## 2012-09-11 ENCOUNTER — Other Ambulatory Visit: Payer: Self-pay | Admitting: Family Medicine

## 2012-09-12 ENCOUNTER — Other Ambulatory Visit: Payer: Self-pay | Admitting: Family Medicine

## 2012-09-14 ENCOUNTER — Other Ambulatory Visit: Payer: Self-pay | Admitting: Family Medicine

## 2012-10-01 ENCOUNTER — Other Ambulatory Visit: Payer: Self-pay | Admitting: Family Medicine

## 2012-10-02 ENCOUNTER — Encounter: Payer: Self-pay | Admitting: Family Medicine

## 2012-10-02 ENCOUNTER — Ambulatory Visit (INDEPENDENT_AMBULATORY_CARE_PROVIDER_SITE_OTHER): Payer: Medicare Other | Admitting: Family Medicine

## 2012-10-02 VITALS — BP 110/58 | HR 80 | Temp 97.6°F | Resp 16 | Wt 213.0 lb

## 2012-10-02 DIAGNOSIS — H9190 Unspecified hearing loss, unspecified ear: Secondary | ICD-10-CM

## 2012-10-02 DIAGNOSIS — H9191 Unspecified hearing loss, right ear: Secondary | ICD-10-CM

## 2012-10-02 NOTE — Progress Notes (Signed)
Subjective:    Patient ID: Lonnie Barron, male    DOB: [birth year], 79 y.o.   MRN: [id number]  HPI  Patient reports that he has had trouble hearing out of both ears for quite some time.  However 48 hours ago he experienced sudden hearing loss in his right ear. He states it is extremely noticeable and he is essentially deaf in that ear. This is a sudden change. There is no cerumen impaction today on examination. He denies tinnitus or vertigo. He denies headache or head trauma. He denies fevers or chills. He denies any recent viral infection. Past Medical History  Diagnosis Date  . Hypertension   . PAC (premature atrial contraction)   . PVC's (premature ventricular contractions)   . Tobacco dependence   . Diabetes mellitus   . Hyperlipidemia   . Prostate cancer   . Osteoarthritis   . Peripheral neuropathy   . Complication of anesthesia     ?, became limp after surgery -was hospitalized  . COPD (chronic obstructive pulmonary disease)    Current Outpatient Prescriptions on File Prior to Visit  Medication Sig Dispense Refill  . albuterol (PROVENTIL HFA;VENTOLIN HFA) 108 (90 BASE) MCG/ACT inhaler Inhale 2 puffs into the lungs every 4 (four) hours as needed for wheezing or shortness of breath.  1 Inhaler  0  . amLODipine (NORVASC) 5 MG tablet TAKE 1 TABLET EVERY DAY  90 tablet  1  . aspirin EC 81 MG tablet Take 81 mg by mouth daily.      . cholecalciferol (VITAMIN D) 1000 UNITS tablet Take 2,000 Units by mouth 3 (three) times daily.      . clonazePAM (KLONOPIN) 0.5 MG tablet Take 1 tablet (0.5 mg total) by mouth as directed. For anxiety  30 tablet  2  . Cyanocobalamin (VITAMIN B12 PO) Take by mouth daily.      . fish oil-omega-3 fatty acids 1000 MG capsule Take 620 mg by mouth as directed.       . gabapentin (NEURONTIN) 600 MG tablet TAKE 1 TABLET 4 TIMES DAILY  120 tablet  4  . hydrochlorothiazide (HYDRODIURIL) 25 MG tablet Take 1 tablet (25 mg total) by mouth daily.  30 tablet  11  .  losartan (COZAAR) 100 MG tablet TAKE 1 TABLET EVERY DAY  30 tablet  4  . meloxicam (MOBIC) 15 MG tablet TAKE 1 TABLET EVERY DAY  30 tablet  4  . metFORMIN (GLUCOPHAGE) 1000 MG tablet TAKE 1 TABLET TWICE DAILY  60 tablet  4  . metoprolol succinate (TOPROL-XL) 50 MG 24 hr tablet TAKE 1 TABLET EVERY DAY  30 tablet  5  . NITROSTAT 0.4 MG SL tablet       . OVER THE COUNTER MEDICATION Take 2 tablets by mouth 2 (two) times daily. Formula 303. Muscle relaxer.      . simvastatin (ZOCOR) 20 MG tablet TAKE 1 TABLET AT BEDTIME  30 tablet  5  . tiotropium (SPIRIVA) 18 MCG inhalation capsule Place 1 capsule (18 mcg total) into inhaler and inhale daily.  30 capsule  0  . traMADol (ULTRAM) 50 MG tablet TAKE 1 TABLET (50 MG TOTAL) BY MOUTH EVERY 6 (SIX) HOURS AS NEEDED FOR PAIN.  90 tablet  0  . vitamin E 400 UNIT capsule Take 400 Units by mouth daily.         No current facility-administered medications on file prior to visit.   Allergies  Allergen Reactions  . Ciprofloxacin Hives, Shortness Of Breath  and Itching  . Tramadol Other (See Comments)    unknown  . Codeine Anxiety   History   Social History  . Marital Status: Married    Spouse Name: N/A    Number of Children: 3  . Years of Education: N/A   Occupational History  . retired    Social History Main Topics  . Smoking status: Current Every Day Smoker -- 1.50 packs/day for 65 years  . Smokeless tobacco: Never Used  . Alcohol Use: No  . Drug Use: No  . Sexually Active: No   Other Topics Concern  . Not on file   Social History Narrative  . No narrative on file     Review of Systems  All other systems reviewed and are negative.       Objective:   Physical Exam  Vitals reviewed. Constitutional: He is oriented to person, place, and time. He appears well-developed and well-nourished.  HENT:  Head: Normocephalic.  Right Ear: Tympanic membrane, external ear and ear canal normal. Decreased hearing is noted.  Left Ear: Tympanic  membrane, external ear and ear canal normal. Decreased hearing is noted.  Mouth/Throat: Oropharynx is clear and moist.  Neurological: He is alert and oriented to person, place, and time. He has normal reflexes. He displays normal reflexes. No cranial nerve deficit. He exhibits normal muscle tone. Coordination normal.  Patient denies lateralization with Weber test.  Air conduction is greater than bone conduction with Rinne test.  On hearing screen today, the patient cannot hear even up to 40 dB in either ear. However he can completely understand my conversation with his left ear. He is having a difficult time understanding my words with his right ear        Assessment & Plan:  1. Hearing loss, right I believe the patient has chronic long-standing hearing loss in both ears. However I am very concerned that his hearing has acutely worsened in his right ear. This is concerning for sudden sensorineural hearing loss in his right ear. There is no evidence for a stroke today on examination. The most likely source could be vasculitis versus infection versus a pathologic lesion on the eighth cranial nerve.  The patient most likely requires an MRI, corticosteroids, and ENT evaluation. I'm going to try to arrange an ENT evaluation as soon as possible.  ENT has graciously agreed to see the patient today.  I will defer further workup to their judgment.  I greatly appreciate their help.

## 2012-10-12 ENCOUNTER — Telehealth: Payer: Self-pay | Admitting: Family Medicine

## 2012-10-12 ENCOUNTER — Other Ambulatory Visit: Payer: Self-pay | Admitting: Family Medicine

## 2012-10-12 NOTE — Telephone Encounter (Signed)
ok 

## 2012-10-12 NOTE — Telephone Encounter (Signed)
Medication refilled per protocol. 

## 2012-10-12 NOTE — Telephone Encounter (Signed)
?   OK to Refill  

## 2012-10-13 ENCOUNTER — Other Ambulatory Visit: Payer: Self-pay | Admitting: Family Medicine

## 2012-10-13 MED ORDER — TRAMADOL HCL 50 MG PO TABS: ORAL_TABLET | ORAL | Status: DC

## 2012-10-13 NOTE — Telephone Encounter (Signed)
[facility]

## 2012-10-19 ENCOUNTER — Telehealth: Payer: Self-pay | Admitting: Family Medicine

## 2012-10-19 NOTE — Telephone Encounter (Signed)
Does Dr. Elmer Picker do cataracts and would she see him sooner?

## 2012-10-21 NOTE — Telephone Encounter (Signed)
Tried to call pt and no answer & no vm

## 2012-10-22 NOTE — Telephone Encounter (Signed)
Pt came by office due to phone being out and was given information to contact Dr. Elmer Picker.

## 2012-10-27 ENCOUNTER — Telehealth: Payer: Self-pay | Admitting: Family Medicine

## 2012-10-27 NOTE — Telephone Encounter (Signed)
Pt wants to talk to you about his cataract surgery. Please call when you get a chance. He only wants to talk to you.

## 2012-10-28 NOTE — Telephone Encounter (Signed)
Spoke to pt in office

## 2012-11-12 ENCOUNTER — Other Ambulatory Visit: Payer: Self-pay | Admitting: Family Medicine

## 2012-11-12 NOTE — Telephone Encounter (Signed)
Meds refilled.

## 2012-11-12 NOTE — Telephone Encounter (Signed)
Ok with 2 refill 

## 2012-11-12 NOTE — Telephone Encounter (Signed)
?   OK to Refill  

## 2012-12-08 ENCOUNTER — Ambulatory Visit (INDEPENDENT_AMBULATORY_CARE_PROVIDER_SITE_OTHER): Payer: Medicare Other | Admitting: Family Medicine

## 2012-12-08 ENCOUNTER — Encounter: Payer: Self-pay | Admitting: Family Medicine

## 2012-12-08 VITALS — BP 100/64 | HR 76 | Temp 97.0°F | Resp 20 | Ht 74.0 in | Wt 212.0 lb

## 2012-12-08 DIAGNOSIS — R1310 Dysphagia, unspecified: Secondary | ICD-10-CM

## 2012-12-08 MED ORDER — FIRST-DUKES MOUTHWASH MT SUSP: 5.0000 mL | Freq: Four times a day (QID) | OROMUCOSAL | Status: DC

## 2012-12-08 NOTE — Progress Notes (Signed)
Subjective:    Patient ID: Lonnie Barron, male    DOB: [birth year], 79 y.o.   MRN: [id number]  HPI  Patient presents with 4 weeks of odynophagia.  He reports difficulty and pain swallowing pills and food.  He denies food sticking or liquid sticking in his lower esophagus. The pain is located about the level of the angle of the mandible his right upper larynx.  He has a significant and prolonged history of tobacco abuse. He denies any hematemesis, hemoptysis, weight loss, or fevers.  He denies any indigestion or gastroesophageal reflux disease. He denies any postnasal drip or sinus  Pain. Past Medical History  Diagnosis Date  . Hypertension   . PAC (premature atrial contraction)   . PVC's (premature ventricular contractions)   . Tobacco dependence   . Diabetes mellitus   . Hyperlipidemia   . Prostate cancer   . Osteoarthritis   . Peripheral neuropathy   . Complication of anesthesia     ?, became limp after surgery -was hospitalized  . COPD (chronic obstructive pulmonary disease)    Past Surgical History  Procedure Laterality Date  . Replacement total knee    . Total shoulder replacement    . Cholecystectomy    . Cardiovascular stress test  03/27/2007    EF 51%   Current Outpatient Prescriptions on File Prior to Visit  Medication Sig Dispense Refill  . albuterol (PROVENTIL HFA;VENTOLIN HFA) 108 (90 BASE) MCG/ACT inhaler Inhale 2 puffs into the lungs every 4 (four) hours as needed for wheezing or shortness of breath.  1 Inhaler  0  . amLODipine (NORVASC) 5 MG tablet TAKE 1 TABLET EVERY DAY  90 tablet  1  . aspirin EC 81 MG tablet Take 81 mg by mouth daily.      . cholecalciferol (VITAMIN D) 1000 UNITS tablet Take 2,000 Units by mouth 3 (three) times daily.      . clonazePAM (KLONOPIN) 0.5 MG tablet Take 1 tablet (0.5 mg total) by mouth as directed. For anxiety  30 tablet  2  . Cyanocobalamin (VITAMIN B12 PO) Take by mouth daily.      . fish oil-omega-3 fatty acids 1000 MG  capsule Take 620 mg by mouth as directed.       . gabapentin (NEURONTIN) 600 MG tablet TAKE 1 TABLET 4 TIMES DAILY  120 tablet  4  . hydrochlorothiazide (HYDRODIURIL) 25 MG tablet Take 1 tablet (25 mg total) by mouth daily.  30 tablet  11  . losartan (COZAAR) 100 MG tablet TAKE 1 TABLET EVERY DAY  30 tablet  4  . meloxicam (MOBIC) 15 MG tablet TAKE 1 TABLET EVERY DAY  30 tablet  4  . metFORMIN (GLUCOPHAGE) 1000 MG tablet TAKE 1 TABLET TWICE DAILY  60 tablet  4  . metoprolol succinate (TOPROL-XL) 50 MG 24 hr tablet TAKE 1 TABLET EVERY DAY  30 tablet  5  . NITROSTAT 0.4 MG SL tablet       . OVER THE COUNTER MEDICATION Take 2 tablets by mouth 2 (two) times daily. Formula 303. Muscle relaxer.      . simvastatin (ZOCOR) 20 MG tablet TAKE 1 TABLET AT BEDTIME  30 tablet  5  . tiotropium (SPIRIVA) 18 MCG inhalation capsule Place 1 capsule (18 mcg total) into inhaler and inhale daily.  30 capsule  0  . traMADol (ULTRAM) 50 MG tablet TAKE 1 TABLET EVERY 6 HOURS AS NEEDED FOR PAIN  90 tablet  2  . vitamin E  400 UNIT capsule Take 400 Units by mouth daily.         No current facility-administered medications on file prior to visit.   Allergies  Allergen Reactions  . Ciprofloxacin Hives, Shortness Of Breath and Itching  . Tramadol Other (See Comments)    unknown  . Codeine Anxiety   History   Social History  . Marital Status: Married    Spouse Name: N/A    Number of Children: 3  . Years of Education: N/A   Occupational History  . retired    Social History Main Topics  . Smoking status: Current Every Day Smoker -- 1.50 packs/day for 65 years  . Smokeless tobacco: Never Used  . Alcohol Use: No  . Drug Use: No  . Sexual Activity: No   Other Topics Concern  . Not on file   Social History Narrative  . No narrative on file     Review of Systems  All other systems reviewed and are negative.       Objective:   Physical Exam  Vitals reviewed. Neck: Normal range of motion. Neck  supple. No JVD present. No tracheal deviation present. No thyromegaly present.  Cardiovascular: Normal rate and regular rhythm.   No murmur heard. Pulmonary/Chest: Effort normal. No stridor. No respiratory distress. He has wheezes.  Abdominal: Soft. Bowel sounds are normal.  Lymphadenopathy:    He has no cervical adenopathy.          Assessment & Plan:  1. Odynophagia Begin patient on Magic mouthwash 1 tsp gargle and swallow 4 times a day for one week. I will consult ENT for laryngoscopy. Given his long history of tobacco abuse, I believe we need to rule out possible malignancy in the upper larynx. The location of his pain and dysphagia makes me feel ENT is more appropriate rather than GI as it is in the upper larynx rather than in the esophagus.  If laryngoscopy is negative and symptoms persist we could then proceed with EGD and possibly a CT scan of the neck. - Diphenhyd-Hydrocort-Nystatin (FIRST-DUKES MOUTHWASH) SUSP; Use as directed 5 mLs in the mouth or throat QID.  Dispense: 120 mL; Refill: 0 - Ambulatory referral to ENT

## 2012-12-29 ENCOUNTER — Other Ambulatory Visit: Payer: Self-pay | Admitting: Family Medicine

## 2012-12-29 NOTE — Telephone Encounter (Signed)
ok 

## 2012-12-29 NOTE — Telephone Encounter (Signed)
?   OK to Refill  

## 2013-01-18 ENCOUNTER — Telehealth: Payer: Self-pay | Admitting: Family Medicine

## 2013-01-18 NOTE — Telephone Encounter (Signed)
Needs refill on Tramadol 50 mgs #90 1 Q 6 hrs

## 2013-01-18 NOTE — Telephone Encounter (Signed)
?   OK to Refill  

## 2013-01-19 MED ORDER — TRAMADOL HCL 50 MG PO TABS: ORAL_TABLET | ORAL | Status: DC

## 2013-01-19 NOTE — Telephone Encounter (Signed)
rx refilled.

## 2013-01-19 NOTE — Telephone Encounter (Signed)
ok 

## 2013-01-25 ENCOUNTER — Telehealth: Payer: Self-pay | Admitting: Family Medicine

## 2013-01-25 MED ORDER — NITROGLYCERIN 0.4 MG SL SUBL: 0.4000 mg | SUBLINGUAL_TABLET | SUBLINGUAL | Status: AC | PRN

## 2013-01-25 NOTE — Telephone Encounter (Signed)
Pt has been taking Nitrastat for couple years and he doesn't take it all the time but likes to have it on hand and the prescription is out of date and he would like to have a new prescription [facility]  Call back [phone]

## 2013-01-25 NOTE — Telephone Encounter (Signed)
Med sent to pharm 

## 2013-01-30 ENCOUNTER — Other Ambulatory Visit: Payer: Self-pay | Admitting: Family Medicine

## 2013-02-01 NOTE — Telephone Encounter (Signed)
Ok to refill 

## 2013-02-02 NOTE — Telephone Encounter (Signed)
ok 

## 2013-02-22 ENCOUNTER — Other Ambulatory Visit: Payer: Self-pay | Admitting: Family Medicine

## 2013-03-01 ENCOUNTER — Ambulatory Visit (INDEPENDENT_AMBULATORY_CARE_PROVIDER_SITE_OTHER): Payer: Medicare Other | Admitting: Family Medicine

## 2013-03-01 ENCOUNTER — Encounter: Payer: Self-pay | Admitting: Family Medicine

## 2013-03-01 VITALS — BP 124/68 | HR 66 | Temp 97.8°F | Resp 18 | Ht 74.0 in | Wt 214.0 lb

## 2013-03-01 DIAGNOSIS — I10 Essential (primary) hypertension: Secondary | ICD-10-CM

## 2013-03-01 DIAGNOSIS — Z23 Encounter for immunization: Secondary | ICD-10-CM

## 2013-03-01 DIAGNOSIS — G629 Polyneuropathy, unspecified: Secondary | ICD-10-CM

## 2013-03-01 DIAGNOSIS — E119 Type 2 diabetes mellitus without complications: Secondary | ICD-10-CM

## 2013-03-01 DIAGNOSIS — G609 Hereditary and idiopathic neuropathy, unspecified: Secondary | ICD-10-CM

## 2013-03-01 DIAGNOSIS — E785 Hyperlipidemia, unspecified: Secondary | ICD-10-CM

## 2013-03-01 MED ORDER — DULOXETINE HCL 60 MG PO CPEP: 60.0000 mg | ORAL_CAPSULE | Freq: Every day | ORAL | Status: DC

## 2013-03-01 NOTE — Progress Notes (Signed)
Subjective:    Patient ID: Lonnie Barron, male    DOB: [birth year], 80 y.o.   MRN: [id number]  HPI Patient is a very pleasant 80 year old white male who comes in today for followup of his hypertension, hyperlipidemia, and diabetes mellitus type 2. He also has a history of COPD. He continues to smoke and has no plans to discontinue smoking. His blood pressure is well controlled today at 124/68. He denies any chest pain. He is overdue for hemoglobin A1c. He denies any hyperglycemia or hypoglycemia. However the patient is not regularly checking his sugars. He denies any polyuria, polydipsia, or blurred vision. He also has a history of hyperlipidemia.  He is currently taking simvastatin 20 mg by mouth daily. He denies any myalgias or right upper quadrant pain. He is overdue for fasting lipid panel. Unfortunately his primary concern is his unrelenting peripheral neuropathy. He describes burning pins and needles pain in both fingertips and both feet. He is currently taking gabapentin 600 mg by mouth 4 times a day with only minimal benefit. He is also taking tramadol 50 mg by mouth 3 times a day. He is interested in other options for neuropathic pain.  He is also due for Prevnar 13. Past Medical History  Diagnosis Date  . Hypertension   . PAC (premature atrial contraction)   . PVC's (premature ventricular contractions)   . Tobacco dependence   . Diabetes mellitus   . Hyperlipidemia   . Prostate cancer   . Osteoarthritis   . Peripheral neuropathy   . Complication of anesthesia     ?, became limp after surgery -was hospitalized  . COPD (chronic obstructive pulmonary disease)     Past Surgical History  Procedure Laterality Date  . Replacement total knee    . Total shoulder replacement    . Cholecystectomy    . Cardiovascular stress test  03/27/2007    EF 51%   Current Outpatient Prescriptions on File Prior to Visit  Medication Sig Dispense Refill  . amLODipine (NORVASC) 5 MG tablet TAKE 1  TABLET EVERY DAY  90 tablet  1  . aspirin EC 81 MG tablet Take 81 mg by mouth daily.      . cholecalciferol (VITAMIN D) 1000 UNITS tablet Take 2,000 Units by mouth 3 (three) times daily.      . clonazePAM (KLONOPIN) 0.5 MG tablet TAKE 1 TABLET BY MOUTH EVERY DAY AS NEEDED  30 tablet  2  . Cyanocobalamin (VITAMIN B12 PO) Take by mouth daily.      . fish oil-omega-3 fatty acids 1000 MG capsule Take 620 mg by mouth as directed.       . gabapentin (NEURONTIN) 600 MG tablet TAKE 1 TABLET 4 TIMES DAILY  120 tablet  4  . hydrochlorothiazide (HYDRODIURIL) 25 MG tablet Take 1 tablet (25 mg total) by mouth daily.  30 tablet  11  . losartan (COZAAR) 100 MG tablet TAKE 1 TABLET EVERY DAY  30 tablet  4  . losartan (COZAAR) 100 MG tablet TAKE 1 TABLET EVERY DAY  30 tablet  4  . meloxicam (MOBIC) 15 MG tablet TAKE 1 TABLET EVERY DAY  30 tablet  4  . metFORMIN (GLUCOPHAGE) 1000 MG tablet TAKE 1 TABLET TWICE DAILY  60 tablet  4  . metoprolol succinate (TOPROL-XL) 50 MG 24 hr tablet TAKE 1 TABLET EVERY DAY  30 tablet  5  . nitroGLYCERIN (NITROSTAT) 0.4 MG SL tablet Place 1 tablet (0.4 mg total) under the tongue every  5 (five) minutes as needed for chest pain.  25 tablet  2  . OVER THE COUNTER MEDICATION Take 2 tablets by mouth 2 (two) times daily. Formula 303. Muscle relaxer.      . simvastatin (ZOCOR) 20 MG tablet TAKE 1 TABLET AT BEDTIME  30 tablet  5  . tiotropium (SPIRIVA) 18 MCG inhalation capsule Place 1 capsule (18 mcg total) into inhaler and inhale daily.  30 capsule  0  . traMADol (ULTRAM) 50 MG tablet TAKE 1 TABLET EVERY 6 HOURS AS NEEDED FOR PAIN  90 tablet  2  . vitamin E 400 UNIT capsule Take 400 Units by mouth daily.        Marland Kitchen albuterol (PROVENTIL HFA;VENTOLIN HFA) 108 (90 BASE) MCG/ACT inhaler Inhale 2 puffs into the lungs every 4 (four) hours as needed for wheezing or shortness of breath.  1 Inhaler  0   No current facility-administered medications on file prior to visit.   Allergies  Allergen  Reactions  . Ciprofloxacin Hives, Shortness Of Breath and Itching  . Tramadol Other (See Comments)    unknown  . Codeine Anxiety   History   Social History  . Marital Status: Married    Spouse Name: N/A    Number of Children: 3  . Years of Education: N/A   Occupational History  . retired    Social History Main Topics  . Smoking status: Current Every Day Smoker -- 1.50 packs/day for 65 years  . Smokeless tobacco: Never Used  . Alcohol Use: No  . Drug Use: No  . Sexual Activity: No   Other Topics Concern  . Not on file   Social History Narrative  . No narrative on file      Review of Systems  All other systems reviewed and are negative.       Objective:   Physical Exam  Vitals reviewed. Constitutional: He is oriented to person, place, and time.  Neck: Neck supple. No JVD present. No thyromegaly present.  Cardiovascular: Normal rate, regular rhythm, normal heart sounds and intact distal pulses.  Exam reveals no gallop and no friction rub.   No murmur heard. Pulmonary/Chest: Effort normal. No respiratory distress. He has wheezes. He has no rales. He exhibits no tenderness.  Abdominal: Soft. Bowel sounds are normal. He exhibits no distension and no mass. There is no tenderness. There is no rebound and no guarding.  Musculoskeletal: He exhibits no edema and no tenderness.  Lymphadenopathy:    He has no cervical adenopathy.  Neurological: He is alert and oriented to person, place, and time. He has normal reflexes. He displays normal reflexes. No cranial nerve deficit. He exhibits normal muscle tone. Coordination normal.  Skin: Skin is warm. No rash noted. No erythema. No pallor.          Assessment & Plan:  1. Peripheral neuropathy Continued gabapentin 600 mg by mouth 3 times a day. Add Cymbalta 60 mg by mouth daily. Increase tramadol to 100 mg by mouth 3 times a day. Recheck in one month. If symptoms do not improve, I would consider replacing gabapentin with  lyrica. DULoxetine (CYMBALTA) 60 MG capsule; Take 1 capsule (60 mg total) by mouth daily.  Dispense: 30 capsule; Refill: 3  2. Type II or unspecified type diabetes mellitus without mention of complication, not stated as uncontrolled I continue to recommend smoking cessation. Positive the patient had not 13. I will have him return fasting for hemoglobin A1c. His goal hemoglobin A1c is less than  6.5. - COMPLETE METABOLIC PANEL WITH GFR; Future - Hemoglobin A1c; Future - Microalbumin, urine; Future  3. HLD (hyperlipidemia) Return fasting for a fasting lipid panel. Goal LDL is less than 70 given his history of diabetes and smoking. - Lipid panel; Future  4. HTN (hypertension) Blood pressures well controlled. Continue current medications at their present dosages - COMPLETE METABOLIC PANEL WITH GFR; Future

## 2013-03-02 NOTE — Addendum Note (Signed)
Addended by: Shary Decamp B on: 03/02/2013 08:38 AM   Modules accepted: Orders

## 2013-03-09 ENCOUNTER — Other Ambulatory Visit: Payer: Medicare Other

## 2013-03-09 LAB — COMPLETE METABOLIC PANEL WITH GFR
ALT: 31 U/L (ref 0–53)
AST: 32 U/L (ref 0–37)
Albumin: 4 g/dL (ref 3.5–5.2)
Alkaline Phosphatase: 68 U/L (ref 39–117)
BILIRUBIN TOTAL: 0.9 mg/dL (ref 0.3–1.2)
BUN: 16 mg/dL (ref 6–23)
CO2: 29 mEq/L (ref 19–32)
CREATININE: 1.08 mg/dL (ref 0.50–1.35)
Calcium: 10 mg/dL (ref 8.4–10.5)
Chloride: 102 mEq/L (ref 96–112)
GFR, EST AFRICAN AMERICAN: 74 mL/min
GFR, Est Non African American: 64 mL/min
Glucose, Bld: 90 mg/dL (ref 70–99)
Potassium: 4.5 mEq/L (ref 3.5–5.3)
Sodium: 137 mEq/L (ref 135–145)
Total Protein: 6.3 g/dL (ref 6.0–8.3)

## 2013-03-09 LAB — LIPID PANEL
CHOL/HDL RATIO: 2.9 ratio
CHOLESTEROL: 89 mg/dL (ref 0–200)
HDL: 31 mg/dL — ABNORMAL LOW (ref >39)
LDL Cholesterol: 39 mg/dL (ref 0–99)
Triglycerides: 96 mg/dL (ref <150)
VLDL: 19 mg/dL (ref 0–40)

## 2013-03-09 LAB — HEMOGLOBIN A1C
Hgb A1c MFr Bld: 5.9 % — ABNORMAL HIGH (ref <5.7)
MEAN PLASMA GLUCOSE: 123 mg/dL — AB (ref <117)

## 2013-03-09 LAB — MICROALBUMIN, URINE: Microalb, Ur: 212.49 mg/dL — ABNORMAL HIGH (ref 0.00–1.89)

## 2013-03-09 NOTE — Addendum Note (Signed)
Addended by: WRAY, [place] on: 03/09/2013 08:36 AM   Modules accepted: Orders

## 2013-03-09 NOTE — Addendum Note (Signed)
Addended by: WRAY, [place] on: 03/09/2013 08:41 AM   Modules accepted: Orders

## 2013-03-12 ENCOUNTER — Telehealth: Payer: Self-pay | Admitting: Family Medicine

## 2013-03-12 ENCOUNTER — Other Ambulatory Visit: Payer: Self-pay | Admitting: Family Medicine

## 2013-03-12 MED ORDER — TRAMADOL HCL 50 MG PO TABS: ORAL_TABLET | ORAL | Status: DC

## 2013-03-12 NOTE — Telephone Encounter (Signed)
rx was printed and faxed to pharmacy 

## 2013-03-12 NOTE — Telephone Encounter (Signed)
Message copied by Alyson Locket on Fri Mar 12, 2013  2:44 PM ------      Message from: [facility], Charlynne Cousins      Created: Fri Mar 12, 2013  1:30 PM      Contact: [phone]       Needs more Tramadol. He states that we were to double the amount.      Call in to [facility] ------

## 2013-03-13 ENCOUNTER — Other Ambulatory Visit: Payer: Self-pay | Admitting: Family Medicine

## 2013-03-14 ENCOUNTER — Other Ambulatory Visit: Payer: Self-pay | Admitting: Family Medicine

## 2013-03-29 ENCOUNTER — Other Ambulatory Visit: Payer: Self-pay | Admitting: Family Medicine

## 2013-04-05 ENCOUNTER — Other Ambulatory Visit: Payer: Self-pay | Admitting: Family Medicine

## 2013-04-05 NOTE — Telephone Encounter (Signed)
?   OK to Refill  

## 2013-04-06 NOTE — Telephone Encounter (Signed)
ok 

## 2013-04-11 ENCOUNTER — Other Ambulatory Visit: Payer: Self-pay | Admitting: Family Medicine

## 2013-04-29 ENCOUNTER — Telehealth: Payer: Self-pay | Admitting: Family Medicine

## 2013-04-29 MED ORDER — MECLIZINE HCL 25 MG PO TABS: 25.0000 mg | ORAL_TABLET | Freq: Three times a day (TID) | ORAL | Status: DC

## 2013-04-29 NOTE — Telephone Encounter (Signed)
.  Patient aware and med sent to pharm 

## 2013-04-29 NOTE — Telephone Encounter (Signed)
Ok with meclizine 25 q 8 hrs prn vertigo, NTBS if not vertigo.

## 2013-04-29 NOTE — Telephone Encounter (Signed)
Last few weeks been having occ. Dizzy spells.  Took meclizine from [facility] in past and is wanting new RX.

## 2013-04-30 ENCOUNTER — Other Ambulatory Visit: Payer: Self-pay | Admitting: Family Medicine

## 2013-05-03 NOTE — Telephone Encounter (Signed)
ok 

## 2013-05-03 NOTE — Telephone Encounter (Signed)
?   OK to Refill  

## 2013-05-06 ENCOUNTER — Encounter: Payer: Self-pay | Admitting: Family Medicine

## 2013-05-06 ENCOUNTER — Ambulatory Visit (INDEPENDENT_AMBULATORY_CARE_PROVIDER_SITE_OTHER): Payer: Medicare Other | Admitting: Family Medicine

## 2013-05-06 VITALS — BP 140/72 | HR 60 | Temp 96.6°F | Resp 20 | Ht 74.0 in | Wt 213.0 lb

## 2013-05-06 DIAGNOSIS — N6321 Unspecified lump in the left breast, upper outer quadrant: Secondary | ICD-10-CM

## 2013-05-06 DIAGNOSIS — E119 Type 2 diabetes mellitus without complications: Secondary | ICD-10-CM

## 2013-05-06 DIAGNOSIS — R358 Other polyuria: Secondary | ICD-10-CM

## 2013-05-06 DIAGNOSIS — R2689 Other abnormalities of gait and mobility: Secondary | ICD-10-CM

## 2013-05-06 DIAGNOSIS — R3589 Other polyuria: Secondary | ICD-10-CM

## 2013-05-06 DIAGNOSIS — N63 Unspecified lump in unspecified breast: Secondary | ICD-10-CM

## 2013-05-06 DIAGNOSIS — R29818 Other symptoms and signs involving the nervous system: Secondary | ICD-10-CM

## 2013-05-06 LAB — BASIC METABOLIC PANEL
BUN: 14 mg/dL (ref 6–23)
CALCIUM: 9.8 mg/dL (ref 8.4–10.5)
CO2: 29 mEq/L (ref 19–32)
Chloride: 101 mEq/L (ref 96–112)
Creat: 0.96 mg/dL (ref 0.50–1.35)
GLUCOSE: 117 mg/dL — AB (ref 70–99)
POTASSIUM: 4.5 meq/L (ref 3.5–5.3)
Sodium: 140 mEq/L (ref 135–145)

## 2013-05-06 NOTE — Progress Notes (Signed)
Subjective:    Patient ID: Lonnie Barron, male    DOB: 1928/04/22, 80 y.o.   MRN: [id number]  HPI Patient is an 80 year old white male who presents with 3 medical complaints. Problem #1, the patient reports a tender lump in his left breast at approximately the 2:00 position. He noticed this 3 days ago. Has stents shrunk in size and is no longer palpable. It is only mildly tender today. Problem #2 the patient reports polyuria. He believes 20 times a day. He is a normal amount. He denies any urgency, hesitancy, dysuria or hematuria. He had his prostate checked 2 months ago and his PSA was undetectable. #3 the patient reports balance. He has a history of peripheral neuropathy. His cerebellar exam today is normal. He denies any strokelike symptoms. He states he staggers because he cannot feel when he walks. Past Medical History  Diagnosis Date  . Hypertension   . PAC (premature atrial contraction)   . PVC's (premature ventricular contractions)   . Tobacco dependence   . Diabetes mellitus   . Hyperlipidemia   . Prostate cancer   . Osteoarthritis   . Peripheral neuropathy   . Complication of anesthesia     ?, became limp after surgery -was hospitalized  . COPD (chronic obstructive pulmonary disease)    Current Outpatient Prescriptions on File Prior to Visit  Medication Sig Dispense Refill  . albuterol (PROVENTIL HFA;VENTOLIN HFA) 108 (90 BASE) MCG/ACT inhaler Inhale 2 puffs into the lungs every 4 (four) hours as needed for wheezing or shortness of breath.  1 Inhaler  0  . amLODipine (NORVASC) 5 MG tablet TAKE 1 TABLET EVERY DAY  90 tablet  1  . aspirin EC 81 MG tablet Take 81 mg by mouth daily.      . cholecalciferol (VITAMIN D) 1000 UNITS tablet Take 2,000 Units by mouth 3 (three) times daily.      . clonazePAM (KLONOPIN) 0.5 MG tablet TAKE 1 TABLET BY MOUTH EVERY DAY AS NEEDED  30 tablet  2  . Cyanocobalamin (VITAMIN B12 PO) Take by mouth daily.      . DULoxetine (CYMBALTA) 60 MG  capsule Take 1 capsule (60 mg total) by mouth daily.  30 capsule  3  . fish oil-omega-3 fatty acids 1000 MG capsule Take 620 mg by mouth as directed.       . gabapentin (NEURONTIN) 600 MG tablet TAKE 1 TABLET 4 TIMES DAILY  120 tablet  4  . hydrochlorothiazide (HYDRODIURIL) 25 MG tablet Take 1 tablet (25 mg total) by mouth daily.  30 tablet  11  . losartan (COZAAR) 100 MG tablet TAKE 1 TABLET EVERY DAY  30 tablet  4  . losartan (COZAAR) 100 MG tablet TAKE 1 TABLET EVERY DAY  30 tablet  4  . meclizine (ANTIVERT) 25 MG tablet Take 1 tablet (25 mg total) by mouth every 8 (eight) hours. PRN dizziness  30 tablet  0  . meloxicam (MOBIC) 15 MG tablet TAKE 1 TABLET EVERY DAY  30 tablet  4  . metFORMIN (GLUCOPHAGE) 1000 MG tablet TAKE 1 TABLET TWICE DAILY  60 tablet  4  . metoprolol succinate (TOPROL-XL) 50 MG 24 hr tablet TAKE 1 TABLET EVERY DAY  30 tablet  5  . metoprolol succinate (TOPROL-XL) 50 MG 24 hr tablet TAKE 1 TABLET EVERY DAY  30 tablet  5  . nitroGLYCERIN (NITROSTAT) 0.4 MG SL tablet Place 1 tablet (0.4 mg total) under the tongue every 5 (five) minutes as  needed for chest pain.  25 tablet  2  . OVER THE COUNTER MEDICATION Take 2 tablets by mouth 2 (two) times daily. Formula 303. Muscle relaxer.      . simvastatin (ZOCOR) 20 MG tablet TAKE 1 TABLET AT BEDTIME  30 tablet  5  . simvastatin (ZOCOR) 20 MG tablet TAKE 1 TABLET AT BEDTIME  30 tablet  5  . SPIRIVA HANDIHALER 18 MCG inhalation capsule INHALE CONTENTS OF 1 CAPSULE ONCE DAILY AS DIRECTED  30 capsule  11  . traMADol (ULTRAM) 50 MG tablet TAKE 2 TABLET TID AS NEEDED FOR PAIN  180 tablet  2  . vitamin E 400 UNIT capsule Take 400 Units by mouth daily.         No current facility-administered medications on file prior to visit.   Allergies  Allergen Reactions  . Ciprofloxacin Hives, Shortness Of Breath and Itching  . Tramadol Other (See Comments)    unknown  . Codeine Anxiety   History   Social History  . Marital Status: Married      Spouse Name: N/A    Number of Children: 3  . Years of Education: N/A   Occupational History  . retired    Social History Main Topics  . Smoking status: Current Every Day Smoker -- 1.50 packs/day for 65 years  . Smokeless tobacco: Never Used  . Alcohol Use: No  . Drug Use: No  . Sexual Activity: No   Other Topics Concern  . Not on file   Social History Narrative  . No narrative on file      Review of Systems  All other systems reviewed and are negative.       Objective:   Physical Exam  Vitals reviewed. Constitutional: He is oriented to person, place, and time. He appears well-developed and well-nourished. No distress.  Eyes: EOM are normal. Pupils are equal, round, and reactive to light.  Neck: Neck supple. No JVD present. No thyromegaly present.  Cardiovascular: Normal rate, regular rhythm and normal heart sounds.   Pulmonary/Chest: Effort normal and breath sounds normal. No respiratory distress. He has no wheezes. He has no rales.  Abdominal: Soft. Bowel sounds are normal.  Musculoskeletal: Normal range of motion. He exhibits no edema and no tenderness.  Lymphadenopathy:    He has no cervical adenopathy.  Neurological: He is alert and oriented to person, place, and time. He has normal reflexes. He displays normal reflexes. No cranial nerve deficit. He exhibits normal muscle tone. Coordination normal.  Skin: He is not diaphoretic.          Assessment & Plan:  1. Polyuria Check a BMP and hemoglobin A1c. However I believe the patient has overactive bladder syndrome. I recommended myrbetriq 25 mg poqday and recheck in 2 weeks - Basic Metabolic Panel - Hemoglobin A1c  2. Type II or unspecified type diabetes mellitus without mention of complication, not stated as uncontrolled - Hemoglobin A1c  3. Breast lump on left side at 2 o'clock position On exam today there is no palpable mass or cyst. There is no overlying erythema or skin changes. I believe the  patient experienced transient ductal dilatation and mild irritation that has improved. The mass returns I recommend ultrasound.  4. Balance problem I believe this is multifactorial problem to his peripheral neuropathy, osteoarthritis in knees, and medications. I recommended vestibular rehabilitation. - Ambulatory referral to Physical Therapy

## 2013-05-07 ENCOUNTER — Encounter: Payer: Self-pay | Admitting: *Deleted

## 2013-05-07 LAB — HEMOGLOBIN A1C
HEMOGLOBIN A1C: 5.7 % — AB (ref <5.7)
Mean Plasma Glucose: 117 mg/dL — ABNORMAL HIGH (ref <117)

## 2013-05-07 NOTE — Telephone Encounter (Signed)
This encounter was created in error - please disregard.

## 2013-05-10 ENCOUNTER — Other Ambulatory Visit: Payer: Self-pay | Admitting: Family Medicine

## 2013-05-20 ENCOUNTER — Ambulatory Visit: Payer: Medicare Other | Attending: Family Medicine

## 2013-05-20 DIAGNOSIS — R279 Unspecified lack of coordination: Secondary | ICD-10-CM | POA: Insufficient documentation

## 2013-05-20 DIAGNOSIS — R262 Difficulty in walking, not elsewhere classified: Secondary | ICD-10-CM | POA: Insufficient documentation

## 2013-05-20 DIAGNOSIS — IMO0001 Reserved for inherently not codable concepts without codable children: Secondary | ICD-10-CM | POA: Insufficient documentation

## 2013-05-25 ENCOUNTER — Ambulatory Visit: Payer: Medicare Other | Admitting: Physical Therapy

## 2013-05-26 ENCOUNTER — Ambulatory Visit: Payer: Medicare Other | Attending: Family Medicine

## 2013-05-26 DIAGNOSIS — IMO0001 Reserved for inherently not codable concepts without codable children: Secondary | ICD-10-CM | POA: Insufficient documentation

## 2013-05-26 DIAGNOSIS — R279 Unspecified lack of coordination: Secondary | ICD-10-CM | POA: Insufficient documentation

## 2013-05-26 DIAGNOSIS — R262 Difficulty in walking, not elsewhere classified: Secondary | ICD-10-CM | POA: Insufficient documentation

## 2013-05-31 ENCOUNTER — Ambulatory Visit: Payer: Medicare Other

## 2013-06-04 ENCOUNTER — Ambulatory Visit: Payer: Medicare Other

## 2013-06-08 ENCOUNTER — Ambulatory Visit: Payer: Medicare Other

## 2013-06-10 ENCOUNTER — Ambulatory Visit: Payer: Medicare Other

## 2013-06-15 ENCOUNTER — Ambulatory Visit: Payer: Medicare Other

## 2013-06-17 ENCOUNTER — Ambulatory Visit: Payer: Medicare Other

## 2013-06-20 ENCOUNTER — Other Ambulatory Visit: Payer: Self-pay | Admitting: Family Medicine

## 2013-06-22 ENCOUNTER — Ambulatory Visit: Payer: Medicare Other

## 2013-06-29 ENCOUNTER — Ambulatory Visit: Payer: Medicare Other | Attending: Family Medicine

## 2013-06-29 DIAGNOSIS — IMO0001 Reserved for inherently not codable concepts without codable children: Secondary | ICD-10-CM | POA: Insufficient documentation

## 2013-06-29 DIAGNOSIS — R262 Difficulty in walking, not elsewhere classified: Secondary | ICD-10-CM | POA: Insufficient documentation

## 2013-06-29 DIAGNOSIS — R279 Unspecified lack of coordination: Secondary | ICD-10-CM | POA: Insufficient documentation

## 2013-07-01 ENCOUNTER — Ambulatory Visit: Payer: Medicare Other

## 2013-07-07 ENCOUNTER — Other Ambulatory Visit: Payer: Self-pay | Admitting: Family Medicine

## 2013-07-07 NOTE — Telephone Encounter (Signed)
Ok to refill??  Last office visit 05/06/2013.  Last refill 03/12/2013.

## 2013-07-08 NOTE — Telephone Encounter (Signed)
ok 

## 2013-07-23 ENCOUNTER — Ambulatory Visit: Payer: Medicare Other | Admitting: Family Medicine

## 2013-07-26 ENCOUNTER — Encounter: Payer: Self-pay | Admitting: Family Medicine

## 2013-07-26 ENCOUNTER — Ambulatory Visit (INDEPENDENT_AMBULATORY_CARE_PROVIDER_SITE_OTHER): Payer: Medicare Other | Admitting: Family Medicine

## 2013-07-26 VITALS — BP 110/68 | HR 58 | Temp 96.8°F | Resp 18 | Ht 74.0 in | Wt 198.0 lb

## 2013-07-26 DIAGNOSIS — I10 Essential (primary) hypertension: Secondary | ICD-10-CM

## 2013-07-26 DIAGNOSIS — R1013 Epigastric pain: Secondary | ICD-10-CM

## 2013-07-26 MED ORDER — DOXAZOSIN MESYLATE 4 MG PO TABS: 4.0000 mg | ORAL_TABLET | Freq: Every day | ORAL | Status: DC

## 2013-07-26 NOTE — Progress Notes (Signed)
Subjective:    Patient ID: Lonnie Barron, male    DOB: January 03, 1929, 80 y.o.   MRN: [id number]  HPI Patient is here today because his blood pressures elevated. At home his blood pressures ranging 150-180/80-100. This is extremely consistent. He has checked his blood pressure cuff against his right and found to be accurate. On my check today his blood pressure is elevated at 140/78. He denies any chest pain or shortness of breath. He also takes meloxicam on a daily basis for osteoarthritis. Over the last few weeks he developed epigastric abdominal pain and sharp occasional right upper quadrant abdominal pain. He denies any melena or hematochezia previous history of a cholecystectomy. He also reports reflux and increasing indigestion. Past Medical History  Diagnosis Date  . Hypertension   . PAC (premature atrial contraction)   . PVC's (premature ventricular contractions)   . Tobacco dependence   . Diabetes mellitus   . Hyperlipidemia   . Prostate cancer   . Osteoarthritis   . Peripheral neuropathy   . Complication of anesthesia     ?, became limp after surgery -was hospitalized  . COPD (chronic obstructive pulmonary disease)    Past Surgical History  Procedure Laterality Date  . Replacement total knee    . Total shoulder replacement    . Cholecystectomy    . Cardiovascular stress test  03/27/2007    EF 51%   Current Outpatient Prescriptions on File Prior to Visit  Medication Sig Dispense Refill  . albuterol (PROVENTIL HFA;VENTOLIN HFA) 108 (90 BASE) MCG/ACT inhaler Inhale 2 puffs into the lungs every 4 (four) hours as needed for wheezing or shortness of breath.  1 Inhaler  0  . amLODipine (NORVASC) 5 MG tablet TAKE 1 TABLET EVERY DAY  90 tablet  1  . aspirin EC 81 MG tablet Take 81 mg by mouth daily.      . cholecalciferol (VITAMIN D) 1000 UNITS tablet Take 2,000 Units by mouth 3 (three) times daily.      . clonazePAM (KLONOPIN) 0.5 MG tablet TAKE 1 TABLET BY MOUTH EVERY DAY AS  NEEDED  30 tablet  2  . Cyanocobalamin (VITAMIN B12 PO) Take by mouth daily.      . DULoxetine (CYMBALTA) 60 MG capsule TAKE ONE CAPSULE BY MOUTH EVERY DAY  30 capsule  11  . fish oil-omega-3 fatty acids 1000 MG capsule Take 620 mg by mouth as directed.       . gabapentin (NEURONTIN) 600 MG tablet TAKE 1 TABLET 4 TIMES DAILY  120 tablet  4  . hydrochlorothiazide (HYDRODIURIL) 25 MG tablet Take 1 tablet (25 mg total) by mouth daily.  30 tablet  11  . losartan (COZAAR) 100 MG tablet TAKE 1 TABLET EVERY DAY  30 tablet  4  . meclizine (ANTIVERT) 25 MG tablet Take 1 tablet (25 mg total) by mouth every 8 (eight) hours. PRN dizziness  30 tablet  0  . meloxicam (MOBIC) 15 MG tablet TAKE 1 TABLET EVERY DAY  30 tablet  4  . metFORMIN (GLUCOPHAGE) 1000 MG tablet TAKE 1 TABLET TWICE DAILY  60 tablet  4  . metoprolol succinate (TOPROL-XL) 50 MG 24 hr tablet TAKE 1 TABLET EVERY DAY  30 tablet  5  . metoprolol succinate (TOPROL-XL) 50 MG 24 hr tablet TAKE 1 TABLET EVERY DAY  30 tablet  5  . nitroGLYCERIN (NITROSTAT) 0.4 MG SL tablet Place 1 tablet (0.4 mg total) under the tongue every 5 (five) minutes as needed  for chest pain.  25 tablet  2  . OVER THE COUNTER MEDICATION Take 2 tablets by mouth 2 (two) times daily. Formula 303. Muscle relaxer.      . simvastatin (ZOCOR) 20 MG tablet TAKE 1 TABLET AT BEDTIME  30 tablet  5  . simvastatin (ZOCOR) 20 MG tablet TAKE 1 TABLET AT BEDTIME  30 tablet  5  . SPIRIVA HANDIHALER 18 MCG inhalation capsule INHALE CONTENTS OF 1 CAPSULE ONCE DAILY AS DIRECTED  30 capsule  11  . traMADol (ULTRAM) 50 MG tablet TAKE 2 TABLETS BY MOUTH 3 TIMES A DAY AS NEEDED FOR PAIN  180 tablet  2  . vitamin E 400 UNIT capsule Take 400 Units by mouth daily.         No current facility-administered medications on file prior to visit.   Allergies  Allergen Reactions  . Ciprofloxacin Hives, Shortness Of Breath and Itching  . Tramadol Other (See Comments)    unknown  . Codeine Anxiety   .soc    Review of Systems  All other systems reviewed and are negative.      Objective:   Physical Exam  Vitals reviewed. Constitutional: He appears well-developed and well-nourished. No distress.  HENT:  Mouth/Throat: No oropharyngeal exudate.  Neck: Neck supple.  Cardiovascular: Normal rate, regular rhythm and normal heart sounds.   No murmur heard. Pulmonary/Chest: Effort normal and breath sounds normal. No respiratory distress. He has no wheezes. He has no rales.  Abdominal: Soft. Bowel sounds are normal. He exhibits no distension. There is no tenderness. There is no rebound and no guarding.  Skin: He is not diaphoretic.   There is no palpable mass around his right nipple       Assessment & Plan:  1. HTN (hypertension) Begin cardura 4 mg by mouth daily and recheck blood pressure in 3 weeks. - doxazosin (CARDURA) 4 MG tablet; Take 1 tablet (4 mg total) by mouth daily.  Dispense: 30 tablet; Refill: 3  2. Abdominal pain, epigastric Discontinue meloxicam and begin the patient on dexilant 60 mg by mouth daily and recheck in 2 weeks

## 2013-08-04 ENCOUNTER — Telehealth: Payer: Self-pay | Admitting: Family Medicine

## 2013-08-04 NOTE — Telephone Encounter (Signed)
Pt called and states that he can not take the Dexilant b/c it made him sick to the point he could not function the next day at all. Fatigue. However his pain is much improved and he thinks maybe it was a pulled muscle. His BP has been reading 170/86 , 190/109 the last couple of days and wants to know if he needs to do anything else about his BP???   Also requesting a refill on his Clonazepam - ? OK to Refill

## 2013-08-05 ENCOUNTER — Other Ambulatory Visit: Payer: Self-pay | Admitting: Family Medicine

## 2013-08-05 NOTE — Telephone Encounter (Signed)
BP is elevated, increase amlodipine to 10 mg poqday and recheck bp in 1 week.  IF STILL HIGH, increase doxazosin to 8 mg poqday.

## 2013-08-06 ENCOUNTER — Telehealth: Payer: Self-pay | Admitting: Family Medicine

## 2013-08-06 MED ORDER — CLONAZEPAM 0.5 MG PO TABS: ORAL_TABLET | ORAL | Status: DC

## 2013-08-06 NOTE — Telephone Encounter (Signed)
ok 

## 2013-08-06 NOTE — Telephone Encounter (Signed)
Patient is calling to say he needs his anxiety medication for the weekend or he said he would hate to see what is going to happen [facility] [place] please call and let him know this has been done he said [phone]

## 2013-08-06 NOTE — Telephone Encounter (Signed)
?   OK to Refill  

## 2013-08-06 NOTE — Telephone Encounter (Signed)
Pt's wife aware and will come in next Friday to have BP checked - per [facility] if greater then 140/90 increase the Doxazosin 8mg . Also [facility] ok'd refill on Clonazepam and it was called to pharm.

## 2013-08-13 ENCOUNTER — Ambulatory Visit: Payer: Medicare Other | Admitting: Family Medicine

## 2013-08-13 VITALS — BP 102/86

## 2013-08-13 DIAGNOSIS — I10 Essential (primary) hypertension: Secondary | ICD-10-CM

## 2013-08-13 NOTE — Progress Notes (Signed)
   Subjective:    Patient ID: Lonnie Barron, male    DOB: May 21, 1928, 80 y.o.   MRN: [id number]  HPI    Review of Systems     Objective:   Physical Exam        Assessment & Plan:

## 2013-08-20 ENCOUNTER — Encounter: Payer: Self-pay | Admitting: Family Medicine

## 2013-08-20 ENCOUNTER — Ambulatory Visit (INDEPENDENT_AMBULATORY_CARE_PROVIDER_SITE_OTHER): Payer: Medicare Other | Admitting: Family Medicine

## 2013-08-20 VITALS — BP 100/52 | HR 72 | Temp 96.9°F | Resp 18 | Ht 74.0 in | Wt 198.0 lb

## 2013-08-20 DIAGNOSIS — R634 Abnormal weight loss: Secondary | ICD-10-CM

## 2013-08-20 DIAGNOSIS — I10 Essential (primary) hypertension: Secondary | ICD-10-CM

## 2013-08-20 DIAGNOSIS — R1013 Epigastric pain: Secondary | ICD-10-CM

## 2013-08-20 LAB — CBC WITH DIFFERENTIAL/PLATELET
Basophils Absolute: 0.1 10*3/uL (ref 0.0–0.1)
Basophils Relative: 1 % (ref 0–1)
Eosinophils Absolute: 0.3 10*3/uL (ref 0.0–0.7)
Eosinophils Relative: 5 % (ref 0–5)
HCT: 45.4 % (ref 39.0–52.0)
HEMOGLOBIN: 15.3 g/dL (ref 13.0–17.0)
LYMPHS PCT: 14 % (ref 12–46)
Lymphs Abs: 0.9 10*3/uL (ref 0.7–4.0)
MCH: 32.3 pg (ref 26.0–34.0)
MCHC: 33.7 g/dL (ref 30.0–36.0)
MCV: 96 fL (ref 78.0–100.0)
MONOS PCT: 10 % (ref 3–12)
Monocytes Absolute: 0.6 10*3/uL (ref 0.1–1.0)
NEUTROS ABS: 4.3 10*3/uL (ref 1.7–7.7)
Neutrophils Relative %: 70 % (ref 43–77)
Platelets: 156 10*3/uL (ref 150–400)
RBC: 4.73 MIL/uL (ref 4.22–5.81)
RDW: 14.8 % (ref 11.5–15.5)
WBC: 6.2 10*3/uL (ref 4.0–10.5)

## 2013-08-20 LAB — COMPLETE METABOLIC PANEL WITH GFR
ALK PHOS: 202 U/L — AB (ref 39–117)
ALT: 56 U/L — ABNORMAL HIGH (ref 0–53)
AST: 270 U/L — ABNORMAL HIGH (ref 0–37)
Albumin: 4 g/dL (ref 3.5–5.2)
BILIRUBIN TOTAL: 0.8 mg/dL (ref 0.2–1.2)
BUN: 19 mg/dL (ref 6–23)
CO2: 26 mEq/L (ref 19–32)
CREATININE: 1.04 mg/dL (ref 0.50–1.35)
Calcium: 9.8 mg/dL (ref 8.4–10.5)
Chloride: 101 mEq/L (ref 96–112)
GFR, EST NON AFRICAN AMERICAN: 66 mL/min
GFR, Est African American: 76 mL/min
GLUCOSE: 76 mg/dL (ref 70–99)
Potassium: 4.3 mEq/L (ref 3.5–5.3)
SODIUM: 141 meq/L (ref 135–145)
TOTAL PROTEIN: 6.4 g/dL (ref 6.0–8.3)

## 2013-08-20 LAB — LIPASE: LIPASE: 15 U/L (ref 0–75)

## 2013-08-20 LAB — TSH: TSH: 1.387 u[IU]/mL (ref 0.350–4.500)

## 2013-08-20 NOTE — Progress Notes (Signed)
Subjective:    Patient ID: Lonnie Barron, male    DOB: 15-Mar-1928, 80 y.o.   MRN: [id number]  HPI 07/26/13 Patient is here today because his blood pressure is elevated. At home his blood pressure ss ranging 150-180/80-100. This is extremely consistent. He has checked his blood pressure cuff against his wife's and found it to be accurate. On my check today his blood pressure is elevated at 140/78. He denies any chest pain or shortness of breath. He also takes meloxicam on a daily basis for osteoarthritis. Over the last few weeks he developed epigastric abdominal pain and sharp occasional right upper quadrant abdominal pain. He denies any melena or hematochezia previous history of a cholecystectomy. He also reports reflux and increasing indigestion.  At that time, my plan was: 1. HTN (hypertension) Begin cardura 4 mg by mouth daily and recheck blood pressure in 3 weeks. - doxazosin (CARDURA) 4 MG tablet; Take 1 tablet (4 mg total) by mouth daily.  Dispense: 30 tablet; Refill: 3  2. Abdominal pain, epigastric Discontinue meloxicam and begin the patient on dexilant 60 mg by mouth daily and recheck in 2 weeks  08/20/13 He is here today for follow up. Wt Readings from Last 3 Encounters:  07/26/13 198 lb (89.812 kg)  05/06/13 213 lb (96.616 kg)  03/01/13 214 lb (97.07 kg)   Patient stop the dexilant after one day due to the side effects. He continues to have constant gnawing pain in his epigastric area and right upper quadrant. Patient has lost 16 pounds over the last 3 months. He states he's lost 35 pounds over the last 6 months. He states he has no appetite. He has early satiety. He has this constant abdominal pain. He denies any melena or hematochezia. He does report nausea. He denies any cough or chest pain. He does have a long-term history of smoking. His blood pressure home has been ranging 150-180/80-100. However his capacities inaccurate. The patient is unsure if he began doxazosin after the  last appointment. He did increase amlodipine to 10 mg a day. His blood pressure here is 100/52 in the right arm and 118/60 in the left arm. I checked this myself. Past Medical History  Diagnosis Date  . Hypertension   . PAC (premature atrial contraction)   . PVC's (premature ventricular contractions)   . Tobacco dependence   . Diabetes mellitus   . Hyperlipidemia   . Prostate cancer   . Osteoarthritis   . Peripheral neuropathy   . Complication of anesthesia     ?, became limp after surgery -was hospitalized  . COPD (chronic obstructive pulmonary disease)    Past Surgical History  Procedure Laterality Date  . Replacement total knee    . Total shoulder replacement    . Cholecystectomy    . Cardiovascular stress test  03/27/2007    EF 51%   Current Outpatient Prescriptions on File Prior to Visit  Medication Sig Dispense Refill  . albuterol (PROVENTIL HFA;VENTOLIN HFA) 108 (90 BASE) MCG/ACT inhaler Inhale 2 puffs into the lungs every 4 (four) hours as needed for wheezing or shortness of breath.  1 Inhaler  0  . amLODipine (NORVASC) 5 MG tablet TAKE 2 TABLET EVERY DAY      . aspirin EC 81 MG tablet Take 81 mg by mouth daily.      . cholecalciferol (VITAMIN D) 1000 UNITS tablet Take 2,000 Units by mouth 3 (three) times daily.      . clonazePAM (KLONOPIN) 0.5 MG  tablet TAKE 1 TABLET BY MOUTH EVERY DAY AS NEEDED  30 tablet  2  . Cyanocobalamin (VITAMIN B12 PO) Take by mouth daily.      Marland Kitchen doxazosin (CARDURA) 4 MG tablet Take 1 tablet (4 mg total) by mouth daily.  30 tablet  3  . DULoxetine (CYMBALTA) 60 MG capsule TAKE ONE CAPSULE BY MOUTH EVERY DAY  30 capsule  11  . fish oil-omega-3 fatty acids 1000 MG capsule Take 620 mg by mouth as directed.       . gabapentin (NEURONTIN) 600 MG tablet TAKE 1 TABLET 4 TIMES DAILY  120 tablet  4  . hydrochlorothiazide (HYDRODIURIL) 25 MG tablet Take 1 tablet (25 mg total) by mouth daily.  30 tablet  11  . losartan (COZAAR) 100 MG tablet TAKE 1 TABLET  EVERY DAY  30 tablet  4  . meclizine (ANTIVERT) 25 MG tablet Take 1 tablet (25 mg total) by mouth every 8 (eight) hours. PRN dizziness  30 tablet  0  . meloxicam (MOBIC) 15 MG tablet TAKE 1 TABLET EVERY DAY  30 tablet  4  . metFORMIN (GLUCOPHAGE) 1000 MG tablet TAKE 1 TABLET TWICE DAILY  60 tablet  4  . metoprolol succinate (TOPROL-XL) 50 MG 24 hr tablet TAKE 1 TABLET EVERY DAY  30 tablet  5  . metoprolol succinate (TOPROL-XL) 50 MG 24 hr tablet TAKE 1 TABLET EVERY DAY  30 tablet  5  . nitroGLYCERIN (NITROSTAT) 0.4 MG SL tablet Place 1 tablet (0.4 mg total) under the tongue every 5 (five) minutes as needed for chest pain.  25 tablet  2  . OVER THE COUNTER MEDICATION Take 2 tablets by mouth 2 (two) times daily. Formula 303. Muscle relaxer.      . simvastatin (ZOCOR) 20 MG tablet TAKE 1 TABLET AT BEDTIME  30 tablet  5  . simvastatin (ZOCOR) 20 MG tablet TAKE 1 TABLET AT BEDTIME  30 tablet  5  . SPIRIVA HANDIHALER 18 MCG inhalation capsule INHALE CONTENTS OF 1 CAPSULE ONCE DAILY AS DIRECTED  30 capsule  11  . traMADol (ULTRAM) 50 MG tablet TAKE 2 TABLETS BY MOUTH 3 TIMES A DAY AS NEEDED FOR PAIN  180 tablet  2  . vitamin E 400 UNIT capsule Take 400 Units by mouth daily.         No current facility-administered medications on file prior to visit.   Allergies  Allergen Reactions  . Ciprofloxacin Hives, Shortness Of Breath and Itching  . Tramadol Other (See Comments)    unknown  . Codeine Anxiety  .soc    Review of Systems  All other systems reviewed and are negative.      Objective:   Physical Exam  Vitals reviewed. Constitutional: He appears well-developed and well-nourished. No distress.  HENT:  Mouth/Throat: No oropharyngeal exudate.  Neck: Neck supple.  Cardiovascular: Normal rate, regular rhythm and normal heart sounds.   No murmur heard. Pulmonary/Chest: Effort normal and breath sounds normal. No respiratory distress. He has no wheezes. He has no rales.  Abdominal: Soft.  Bowel sounds are normal. He exhibits no distension. There is no tenderness. There is no rebound and no guarding.  Skin: He is not diaphoretic.         Assessment & Plan:  1. Essential hypertension Blood pressure is well controlled. The patient required no further medication. I asked him to go home and verify his medicine list form he. If he is not taking.doxazosin, he should not start that  medication because it is unnecessary. I want him to call us and verify his list with my nurse.  2. Abdominal pain, epigastric - COMPLETE METABOLIC PANEL WITH GFR - CBC with Differential - TSH - Lipase - CT Abdomen Pelvis W Contrast; Future - Ambulatory referral to Gastroenterology  3. Loss of weight I am day concerned about peptic ulcer disease or possibly a gastric malignancy. Going to schedule the patient for CT scan of abdomen and pelvis to evaluate for pancreatic cancer or masses around the liver. I will schedule patient for a GI consultation for EGD and possibly colonoscopy. Further workup will depend upon the results of these 2 referrals. I also just check a CBC, CMP, TSH, and lipase - COMPLETE METABOLIC PANEL WITH GFR - CBC with Differential - TSH - Lipase - CT Abdomen Pelvis W Contrast; Future - Ambulatory referral to Gastroenterology

## 2013-08-24 ENCOUNTER — Other Ambulatory Visit: Payer: Self-pay | Admitting: Family Medicine

## 2013-08-24 ENCOUNTER — Encounter: Payer: Self-pay | Admitting: Family Medicine

## 2013-08-24 ENCOUNTER — Ambulatory Visit
Admission: RE | Admit: 2013-08-24 | Discharge: 2013-08-24 | Disposition: A | Discharge status: Home / Self Care | Payer: Medicare Other | Source: Ambulatory Visit | Attending: Family Medicine | Admitting: Family Medicine

## 2013-08-24 ENCOUNTER — Telehealth: Payer: Self-pay | Admitting: Family Medicine

## 2013-08-24 DIAGNOSIS — R1013 Epigastric pain: Secondary | ICD-10-CM

## 2013-08-24 DIAGNOSIS — I10 Essential (primary) hypertension: Secondary | ICD-10-CM

## 2013-08-24 DIAGNOSIS — R634 Abnormal weight loss: Secondary | ICD-10-CM

## 2013-08-24 DIAGNOSIS — R160 Hepatomegaly, not elsewhere classified: Secondary | ICD-10-CM

## 2013-08-24 MED ORDER — DOXAZOSIN MESYLATE 4 MG PO TABS: 4.0000 mg | ORAL_TABLET | Freq: Every day | ORAL | Status: DC

## 2013-08-24 MED ORDER — IOHEXOL 300 MG/ML  SOLN
100.0000 mL | Freq: Once | INTRAMUSCULAR | Status: AC | PRN
  Administered 2013-08-24: 100 mL via INTRAVENOUS

## 2013-08-24 NOTE — Progress Notes (Signed)
   Subjective:    Patient ID: Lonnie Barron, male    DOB: [birth year], 80 y.o.   MRN: [id number]  HPI Patient was recently seen for abdominal pain and weight loss. Evaluation revealed elevated liver function test. A CT scan was obtained of the abdomen and pelvis which revealed a large mass in the right lobe of the liver, a 1.7 cm mass in the left kidney, and a portal vein thrombosis. He is here today to discuss his options. I spoke with the radiologist Dr. Thornton Papas who recommended an MRI to evaluate the location of the lesion to see if this would be amenable to biopsy through interventional radiology.   Review of Systems     Objective:   Physical Exam        Assessment & Plan:  I explained the findings on the CT scan to the patient and his wife. I will schedule the patient immediately for an MRI of the abdomen to further evaluate this lesion to see if this is amenable to biopsy through interventional radiology. If not, I recommend a surgery consultation along with the oncology consultation for possible open biopsy and further treatment recommendations.  At this point the chronicity of the portal vein thrombosis is undetermined. The patient may be a candidate for biopsy soon. Therefore I will not start the patient on Lovenox until after his biopsy to reduce the likelihood of  bleeding complications during the biopsy.

## 2013-08-24 NOTE — Telephone Encounter (Addendum)
Pt calling about CT scan.  Wanted to be sure his entire abd was covered.  Told was CT abd/pelvis.  All his organs will be included (liver).  Also had question about Doxazosin.  He said he thinks is already taking.  Told patient he needs to gather all his medication together and call us or bring them in so we can reconcile them.  He said he will do that and have wife call us back.

## 2013-08-24 NOTE — Telephone Encounter (Signed)
Pt wife called back with medications patient has at home.  Medication list was tagged as taking / not taking.  Has started drinking Boost everyday, so has stopped the Vit D and Vit E po.  Told him diabetic may want to get the Glucerna instead of regular Boost.  Also not taking Mobic and Zocor has been put on hold.  Pt states he is taking 1200 mg Fish oil QID.  Added Voltaren gel for Ortho he uses on knees.  Is taking Doxazosin 4 mg QD.

## 2013-08-29 ENCOUNTER — Other Ambulatory Visit: Payer: Medicare Other

## 2013-08-29 ENCOUNTER — Ambulatory Visit
Admission: RE | Admit: 2013-08-29 | Discharge: 2013-08-29 | Disposition: A | Discharge status: Home / Self Care | Payer: Medicare Other | Source: Ambulatory Visit | Attending: Family Medicine | Admitting: Family Medicine

## 2013-08-29 DIAGNOSIS — R160 Hepatomegaly, not elsewhere classified: Secondary | ICD-10-CM

## 2013-08-29 MED ORDER — GADOXETATE DISODIUM 0.25 MMOL/ML IV SOLN
9.0000 mL | Freq: Once | INTRAVENOUS | Status: AC | PRN
  Administered 2013-08-29: 9 mL via INTRAVENOUS

## 2013-08-30 ENCOUNTER — Other Ambulatory Visit: Payer: Self-pay | Admitting: Family Medicine

## 2013-08-30 ENCOUNTER — Other Ambulatory Visit: Payer: Medicare Other

## 2013-08-30 ENCOUNTER — Telehealth: Payer: Self-pay | Admitting: Family Medicine

## 2013-08-30 DIAGNOSIS — R160 Hepatomegaly, not elsewhere classified: Secondary | ICD-10-CM

## 2013-08-30 LAB — HEPATITIS PANEL, ACUTE
HCV Ab: NEGATIVE
HEP A IGM: NONREACTIVE
Hep B C IgM: NONREACTIVE
Hepatitis B Surface Ag: NEGATIVE

## 2013-08-30 NOTE — Telephone Encounter (Signed)
Message copied by Alyson Locket on Mon Aug 30, 2013 12:55 PM ------      Message from: Jenna Luo      Created: Mon Aug 30, 2013  9:56 AM       I spoke with radiology, they do feel they can biopsy lesion but they would recommend afp and viral hepatitis serologies first.  Lets get these labs and then consult hem onc.  Biopsy may not be necessary. ------

## 2013-08-30 NOTE — Telephone Encounter (Signed)
Pt aware via face to face consultation in office with Dr. Dennard Schaumann

## 2013-08-31 ENCOUNTER — Telehealth: Payer: Self-pay | Admitting: Internal Medicine

## 2013-08-31 ENCOUNTER — Telehealth: Payer: Self-pay | Admitting: Family Medicine

## 2013-08-31 LAB — AFP TUMOR MARKER: AFP-Tumor Marker: 1463 ng/mL — ABNORMAL HIGH (ref 0.0–8.0)

## 2013-08-31 NOTE — Telephone Encounter (Signed)
C/D 08/31/13 for appt. 09/06/13

## 2013-08-31 NOTE — Telephone Encounter (Signed)
Called patient and answered her questions.  At the present time the portal vein thrombosis is asymptomatic and likely chronic. Therefore we'll not start anticoagulation until the patient sees the oncologist. If the patient develops  abdominal pain we can certainly start the patient on Lovenox. Also recommended that the patient discontinue metformin indefinitely given the fact he will likely require further imaging with intravenous contrast, the weight loss I do not feel it is  necessary at the present time

## 2013-08-31 NOTE — Telephone Encounter (Signed)
Pt is calling back and wanting to know about the blood clot if it was seen on the MRI and has other questions. Also wanted to know if you wanted him to restart his Metformin. Could you please call and speak to his wife, Stanton Kidney.

## 2013-08-31 NOTE — Telephone Encounter (Signed)
S/W PATIENT WIFE MARY AND GAVE NP APPT FOR 07/13 @ 1:30 W/DR. CHISM.  REFERRING DR. Cletus Gash [facility] DX- LIVER MASS

## 2013-09-02 ENCOUNTER — Other Ambulatory Visit: Payer: Self-pay | Admitting: Family Medicine

## 2013-09-02 LAB — HEPATITIS B SURFACE ANTIBODY,QUALITATIVE: Hep B S Ab: NEGATIVE

## 2013-09-06 ENCOUNTER — Other Ambulatory Visit: Payer: Medicare Other

## 2013-09-06 ENCOUNTER — Ambulatory Visit: Payer: Medicare Other

## 2013-09-06 ENCOUNTER — Encounter: Payer: Self-pay | Admitting: Internal Medicine

## 2013-09-06 ENCOUNTER — Telehealth: Payer: Self-pay | Admitting: Internal Medicine

## 2013-09-06 ENCOUNTER — Ambulatory Visit (HOSPITAL_BASED_OUTPATIENT_CLINIC_OR_DEPARTMENT_OTHER): Payer: Medicare Other | Admitting: Internal Medicine

## 2013-09-06 VITALS — BP 103/47 | HR 71 | Temp 97.4°F | Resp 17 | Ht 74.0 in | Wt 194.9 lb

## 2013-09-06 DIAGNOSIS — J441 Chronic obstructive pulmonary disease with (acute) exacerbation: Secondary | ICD-10-CM

## 2013-09-06 DIAGNOSIS — K769 Liver disease, unspecified: Secondary | ICD-10-CM

## 2013-09-06 DIAGNOSIS — F17200 Nicotine dependence, unspecified, uncomplicated: Secondary | ICD-10-CM

## 2013-09-06 DIAGNOSIS — I81 Portal vein thrombosis: Secondary | ICD-10-CM

## 2013-09-06 DIAGNOSIS — C220 Liver cell carcinoma: Secondary | ICD-10-CM | POA: Insufficient documentation

## 2013-09-06 DIAGNOSIS — R21 Rash and other nonspecific skin eruption: Secondary | ICD-10-CM

## 2013-09-06 DIAGNOSIS — N289 Disorder of kidney and ureter, unspecified: Secondary | ICD-10-CM

## 2013-09-06 NOTE — Progress Notes (Signed)
Checked in new patient with no financial issues. He has appt card he has not seen the dr.

## 2013-09-06 NOTE — Patient Instructions (Signed)
Alpha-Fetoprotein Alpha-Fetoprotein (AFP) is a protein made by the fetal liver. It is normally elevated in the newborn and the mother. In the infant, the value falls to adult values (normally less than 20 ng/ml (nanograms per milliliter) by one year of age. This protein is found in a number of abnormal tumors and conditions. It can be used for a screening test when this is appropriate. PREPARATION FOR TEST No preparation or fasting is required. ELEVATIONS OF AFP ARE CAUSED BY:  Primary hepatocellular carcinoma (cancer of the liver) and the level correlates with tumor size.  A screening test for embryonic teratocarcinomas, hepatoblastomas (tumor of the liver).  Rarely hepatic metastases (cancer spread) from the GI tract.  Some cholangiocarcinomas cause elevations greater than 400 ng/ml.  Rapidly progressing hepatitis can produce levels of AFP in excess of 1000ng/ml.  Lesser levels of hepatitis (inflammation of the liver) can produce levels of 100 to 400 ng/ml. NORMAL FINDINGS   Adult: Less than 40 ng/mL or Less than 40 mg/L (SI units).  Child younger than 1 year: Less than 30ng/mL. Ranges are stratified by weeks of gestation and vary among laboratories. Ranges for normal findings may vary among different laboratories and hospitals. You should always check with your doctor after having lab work or other tests done to discuss the meaning of your test results and whether your values are considered within normal limits. MEANING OF TEST  Your caregiver will go over the test results with you and discuss the importance and meaning of your results, as well as treatment options and the need for additional tests if necessary. OBTAINING THE TEST RESULTS  It is your responsibility to obtain your test results. Ask the lab or department performing the test when and how you will get your results. Document Released: 03/07/2004 Document Revised: 05/06/2011 Document Reviewed: 01/17/2008 [facility] Patient  Information 2015 [place], [place]. This information is not intended to replace advice given to you by your health care provider. Make sure you discuss any questions you have with your health care provider. Liver Cancer Cells divide to form new cells when the body needs them. That happens in the liver, just as it does in other organs. Sometimes, the cells divide too rapidly. Old cells do not die off, and the process gets out of control. A growth (tumor) forms. That is how liver cancer starts. The liver is an important organ of the body. It is located on the upper right side of the belly (abdomen), just below the ribs. It is the largest organ in the body. In an adult man, it is about the size of a football. The liver stores sugar and iron. It also cleans (filters) harmful substances out of the blood. CAUSES  Scientists do not know exactly why cells start to divide rapidly in the liver to form a tumor. It is known that certain behaviors and conditions (risk factors) make liver cancer more likely to develop. They include:  Being male. Men older than 50 have liver cancer more often than other people do.  Having scarring of the liver (cirrhosis).  This occurs when liver cells are damaged and replaced with scar tissue.  Heavy drinking of alcohol for many years can cause cirrhosis, as well as being infected with the hepatitis B or hepatitis C virus (HBV or HCV). HBV and HCV are spread through blood and sexual contact.  Having certain liver diseases, such as:  Hemochromatosis. This disease causes too much iron to be stored in the liver.  Autoimmune hepatitis. In  this disease, the body's immune system turns against the liver.  Wilson's disease. This disease happens when copper builds up in the liver.  Having diabetes, particularly if you also drink alcohol heavily or have chronic viral hepatitis.  Being overweight (obese).  Exposure to alfatoxins. These are substances made by certain types of mold.  They can form on peanuts, corn, wheat, soybeans, and rice. This is not a common cause of liver cancer in the [place] where foods are tested for alfatoxins. SYMPTOMS  Often times, liver cancer often has few symptoms in the beginning. Sometimes, there are none. As the cancer grows, symptoms may include:  Weight loss without dieting.  Loss of appetite.  Nausea.  Feeling very weak and tired.  Pain on the right side of the belly (abdomen).  Feeling full or bloated.  Running a fever for no reason.  The skin or eyes become yellow in color (jaundice).  Dark-colored urine. DIAGNOSIS  Your caregiver may suspect that you have liver cancer based on your symptoms and your physical exam. Other tests will likely be necessary. These may include:  Blood tests, including a test called alpha-fetoprotein (AFP). The blood contains more of this protein if someone has liver cancer.  Imaging tests. These tests may be able to detect cancer or a tumor. These tests include CT scan, MRI scan, or ultrasound.  Liver biopsy. Using medicine to numb your skin, a specialist can insert a needle into the liver and remove some cells. These cells are examined under a microscope to determine if cancer is present. This is the best way to be certain of the diagnosis. TREATMENT  Liver cancer can be treated many ways. The type of treatment will depend on:  The stage of the cancer.  Your age and overall health.  The condition of the liver. This means how well it is working and whether cirrhosis is present. Treatments can be used alone or in combination. Options include:  Surgery.  The part of the liver that has cancer may be removed.  The whole liver may be removed. It would be replaced with a healthy liver (liver transplant).  Radiation. High-energy X-rays kill the cancer cells.  External radiation beams rays from a machine outside the body.  Internal radiation uses tiny spheres (like beads)  that are put into the body. They give off radiation.  Chemotherapy. This treatment uses drugs that kill cancer cells. Options include:  Intravenous chemotherapy. Drugs are put into the blood to travel through the body.  Targeted chemotherapy. This uses a drug that kills liver cancer by blocking the cancer's blood supply.  Chemoembolization. Drugs are injected directly into the liver to kill cancer cells.  Cryoablation. This involves killing the cancer cells by freezing them.  Radiofrequency ablation. This procedure kills the cancer cells by heating them with an electric current. HOME CARE INSTRUCTIONS   Learn as much as you can about liver cancer. It is important to be an informed patient.  Work closely with your caregivers. Fighting cancer takes a team approach.  Take medicines for pain only as prescribed by your caregiver. Follow the directions carefully. Ask before taking any over-the-counter drugs. Be aware that medicines containing acetaminophen can add to liver damage.  Pay attention to what you eat. Nutrition is an important part of recovering from liver cancer. Having this cancer can take away your appetite. So can some of the treatments for it. You might need to limit salt. It is also important to get  the right amount of protein. Ask your caregiver for advice or talk with a nutritionist.  Consider joining a support group. Learning to live with a serious health problem, such as liver cancer, can be difficult. Friends and family can help. Many people also find it helpful to talk with others who are going through the same things you are. Ask your caregiver for a list of groups in your area.  Get rest.  Do not drink alcoholic beverages at all.  Get vaccinated against hepatitis A and hepatitis B. There is no vaccine for hepatitis C. If you are considered at risk for these diseases, get tested. SEEK MEDICAL CARE IF:   You cannot eat because you feel sick to your stomach.  Your  abdomen or legs start to swell. Fluid might be building up.  You feel weaker or more tired than usual.  Your pain gets worse. SEEK IMMEDIATE MEDICAL CARE IF:   Your pain in your abdomen increases suddenly.  You have nausea, vomiting, or diarrhea that does not go away.  You feel confused.  You feel very sleepy during the daytime.  You have any bleeding that does not stop quickly.  You have a fever. Document Released: 06/30/2008 Document Revised: 05/06/2011 Document Reviewed: 06/30/2008 [facility] Patient Information 2015 [place], [place]. This information is not intended to replace advice given to you by your health care provider. Make sure you discuss any questions you have with your health care provider.

## 2013-09-06 NOTE — Progress Notes (Signed)
[place] [phone]   Fax:([phone]) [phone]  NEW PATIENT EVALUATION   Name: Lonnie Barron Date: 09/07/2013 MRN: [id number] DOB: 1928/03/30  PCP: Odette Fraction, MD   REFERRING PHYSICIAN: Susy Frizzle, MD  REASON FOR REFERRAL: Probable [place]  HISTORY OF PRESENT ILLNESS:Lonnie Barron is a 80 y.o. male who is being referred for further evaluation of his newly diagnosed [place] by imaging. He is being referred by Dr. Jenna Barron.  He was last seen by Dr. Dennard Barron on 08/20/2013.  He had a few weeks of epigastric abdominal pain and sharp occasional right upper quandrant abdominal pain.  He denied any melena or hematochezia and notes a piror cholecystectomy about 10 years ago.  As part of his abdominal pain workup, he had CT abdomen pelvis with contrast and was referred to gastroenterology.  His CT of abdomen pelvis with constrast on 08/24/2013 demonstrated a mass superioly within the right lobe of the liver, 6.9 x 4.9 x 6.8 cm, highly suspicous for hepatocellular carcinoma in the setting of a suspected cirrhotic liver.  In addition, thrombus was identified with in left portal vein and occluding the right portal vein, likely representing tumor thrombus thou bland thrombus was not excludeed.  It also revealed an enhancing nodule at lateral left Barron 1.8 x 1.7 cm suspcious for a small renal neoplasm.  An MRI was recommended. It was obtained on 08/29/2013, revealing a large complex liver mass involving almost the entire right hepatic lobe and small associated lesions in the medial segment of the left hepatic lobe.  This was most likely a heaptocellular carcinoma.  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  A biopsy was recommended.  There were bordeline enlarged upper abdominal lymph nodes.  There were simple and complex cysts associated with the left Barron. His AFP was 1,463 on 08/31/2013.  His acute hepatitis panel was negative on 08/31/2013. His LFTs  on 08/20/2013 revealed AST of 270, ALT of 56, and albumin of 4. And total bilirubin of 0.8 and creatinine of 1.04.      Today, he is accompanied by his wife Lonnie Barron. He reports drinking socially but denies heavy alcohol abuse.  He denies a history of hepatitis C or B.   He reports that his older brothers had cancer of neck and lungs (smoker) and other brother with throat cancer (smoker).  He deneis any family history of Barron cancers.  He is a Barrister's clerk.  He reports losing 25 lbs over the past 3 months.    PAST MEDICAL HISTORY:  has a past medical history of Hypertension; PAC (premature atrial contraction); PVC's (premature ventricular contractions); Tobacco dependence; Diabetes mellitus; Hyperlipidemia; Prostate cancer; Osteoarthritis; Peripheral neuropathy; Complication of anesthesia; and COPD (chronic obstructive pulmonary disease).     PAST SURGICAL HISTORY: Past Surgical History  Procedure Laterality Date  . Replacement total knee    . Total shoulder replacement    . Cholecystectomy    . Cardiovascular stress test  03/27/2007    EF 51%     CURRENT MEDICATIONS: has a current medication list which includes the following prescription(s): amlodipine, aspirin ec, clonazepam, cyanocobalamin, duloxetine, gabapentin, losartan, meclizine, metoprolol succinate, nitroglycerin, fish oil, OVER THE COUNTER MEDICATION, spiriva handihaler, tramadol, voltaren, and albuterol.   ALLERGIES: Ciprofloxacin and Codeine   SOCIAL HISTORY:  reports that he has been smoking.  He has never used smokeless tobacco. He reports that he does not drink alcohol or use illicit drugs.   FAMILY HISTORY: family history includes  COPD in his mother; Stroke in his father.   LABORATORY DATA:  CBC    Component Value Date/Time   WBC 6.2 08/20/2013 1011   RBC 4.73 08/20/2013 1011   HGB 15.3 08/20/2013 1011   HCT 45.4 08/20/2013 1011   PLT 156 08/20/2013 1011   MCV 96.0 08/20/2013 1011   MCH 32.3 08/20/2013 1011   MCHC  33.7 08/20/2013 1011   RDW 14.8 08/20/2013 1011   LYMPHSABS 0.9 08/20/2013 1011   MONOABS 0.6 08/20/2013 1011   EOSABS 0.3 08/20/2013 1011   BASOSABS 0.1 08/20/2013 1011    CMP     Component Value Date/Time   NA 141 08/20/2013 1011   K 4.3 08/20/2013 1011   CL 101 08/20/2013 1011   CO2 26 08/20/2013 1011   GLUCOSE 76 08/20/2013 1011   BUN 19 08/20/2013 1011   CREATININE 1.04 08/20/2013 1011   CREATININE 1.1 05/13/2011 0940   CALCIUM 9.8 08/20/2013 1011   PROT 6.4 08/20/2013 1011   ALBUMIN 4.0 08/20/2013 1011   AST 270* 08/20/2013 1011   ALT 56* 08/20/2013 1011   ALKPHOS 202* 08/20/2013 1011   BILITOT 0.8 08/20/2013 1011   GFRNONAA 66 08/20/2013 1011   GFRNONAA 78* 04/18/2011 1247   GFRAA 76 08/20/2013 1011   GFRAA >90 04/18/2011 1247   RADIOGRAPHY: Mr Abdomen W Wo Contrast (personally reviewed by me with the patient)  08/29/2013   CLINICAL DATA:  Liver mass.  EXAM: MRI ABDOMEN WITHOUT AND WITH CONTRAST  TECHNIQUE: Multiplanar multisequence MR imaging of the abdomen was performed both before and after the administration of intravenous contrast.  CONTRAST:  9 cc Eovist  COMPARISON:  CT scan 08/24/2013  FINDINGS: As demonstrated on the CT scan there is a large complex mass involving almost the entire right hepatic lobe and small associated lesions in the medial segment of the left hepatic lobe. This is most likely a hepatocellular carcinoma. Also demonstrated on the CT scan is portal vein thrombosis. The middle and right portal veins are not visualized and likely occluded with tumor/clot. There is also tumor thrombus in the left portal vein but it is not completely occluded. The main portal vein is patent as is the splenic vein.  There are borderline enlarged portal, celiac axis and retroperitoneal lymph nodes. The adrenal glands are normal. There are bilateral simple appearing renal cysts and a complex cyst associated with the lower pole region of the left Barron which appears hemorrhagic.  The aorta and branch  vessels are patent. There are degenerative changes involving the spine but no destructive bone lesions.  IMPRESSION: 1. Large complex liver mass as described above. There is associated intrahepatic portal vein thrombosis. Recommend biopsy. 2. Borderline enlarged upper abdominal lymph nodes. 3. Simple and complex cysts associated with the left Barron.   Electronically Signed   By: Lonnie Barron M.D.   On: 08/29/2013 19:53   Ct Abdomen Pelvis W Contrast (personally reviewed by me with the patient)  08/24/2013   CLINICAL DATA:  30 pounds weight loss in 5 months, anorexia, mid abdominal pain, occasional blood in stool, history prostate cancer, renal calculi, hypertension, smoking, diabetes, COPD  EXAM: CT ABDOMEN AND PELVIS WITH CONTRAST  TECHNIQUE: Multidetector CT imaging of the abdomen and pelvis was performed using the standard protocol following bolus administration of intravenous contrast. Sagittal and coronal MPR images reconstructed from axial data set.  CONTRAST:  173mL OMNIPAQUE IOHEXOL 300 MG/ML SOLN IV. Dilute oral contrast.  COMPARISON:  10/15/2005  FINDINGS: Lung bases  clear.  Filling defect/thrombus identified in LEFT portal vein with occlusion of RIGHT portal vein.  Splenic and superior mesenteric veins patent.  Heterogeneous attenuation of RIGHT lobe of liver may be related to portal vein thrombosis. Additionally, larger area of heterogeneous decreased attenuation is seen superiorly in the RIGHT lobe at dome extending to the central RIGHT lobe, up to 6.9 x 4.9 cm image 15 and 6.8 cm length, highly suspicious for neoplasm.  Enlargement of lateral segment LEFT lobe liver and caudate lobe suggesting underlying cirrhosis.  Spleen mildly enlarged, 14.1 cm length.  No focal abnormalities of the spleen, pancreas, or adrenal glands.  Tiny exophytic nodule upper pole RIGHT Barron 8 mm diameter image 30, indeterminate, previously 5 mm.  Small LEFT renal cysts.  Additional intermediate attenuation nodule  exophytic at lateral LEFT Barron image 36, 1.8 x 1.7 cm, demonstrating washout on delayed images question neoplasm.  Scattered atherosclerotic calcifications aorta.  Stomach incompletely distended, unable to adequately assess wall thickness.  Normal appendix.  Large and small bowel loops otherwise normal appearance.  Surgical clips versus seeds at the prostate bed.  Bladder and ureters unremarkable.  No additional mass, adenopathy, free fluid or inflammatory process.  Multilevel degenerative disc and facet disease changes lumbar spine.  IMPRESSION: Mass superiorly within the RIGHT lobe of the liver, 6.9 x 4.9 x 6.8 cm, highly suspicious for hepatocellular carcinoma in the setting of a suspected cirrhotic liver.  Thrombus identified with in LEFT portal vein and occluding the RIGHT portal vein, likely representing tumor thrombus though bland thrombus is not excluded.  Recommend MR imaging with and without contrast for further assessment.  Enhancing nodule at lateral LEFT Barron 1.8 x 1.7 cm suspicious for a small renal neoplasm.  Findings called to Dr. Dennard Barron on 08/24/2013 at 1625 hours.   Electronically Signed   By: Lonnie Barron M.D.   On: 08/24/2013 16:28   REVIEW OF SYSTEMS:  Constitutional: Denies fevers, chills; as reported in the HPI he has had 30 lb  abnormal weight loss Eyes: Denies blurriness of vision Ears, nose, mouth, throat, and face: Denies mucositis or sore throat Respiratory: Denies cough, dyspnea or wheezes Cardiovascular: Denies palpitation, chest discomfort or lower extremity swelling Gastrointestinal:  Denies nausea, heartburn or change in bowel habits Skin: Denies abnormal skin rashes Lymphatics: Denies new lymphadenopathy or easy bruising Neurological:Denies numbness, tingling or new weaknesses Behavioral/Psych: Mood is stable, no new changes  All other systems were reviewed with the patient and are negative.  PHYSICAL EXAM:  height is 6\' 2"  (1.88 m) and weight is 194 lb 14.4 oz  (88.406 kg). His oral temperature is 97.4 F (36.3 C). His blood pressure is 103/47 and his pulse is 71. His respiration is 17 and oxygen saturation is 98%.   ECOG 2:  GENERAL:alert, no distress and comfortable; chronically ill appearing sitting in wheelchair SKIN: skin color, texture, turgor are normal, no rashes or significant lesions EYES: normal, Conjunctiva are pink and non-injected, sclera clear OROPHARYNX:no exudate, no erythema and lips, buccal mucosa, and tongue normal  NECK: supple, thyroid normal size, non-tender, without nodularity LYMPH:  no palpable lymphadenopathy in the cervical, axillary or inguinal LUNGS: clear to auscultation and percussion with normal breathing effort HEART: regular rate & rhythm and no murmurs and no lower extremity edema ABDOMEN:abdomen soft, Tender to palpation in RUQ and normal bowel sounds; No stigmata of liver disease.  Musculoskeletal:no cyanosis of digits and no clubbing  NEURO: alert & oriented x 3 with fluent speech, no focal motor/sensory  deficits; no asterexis.    IMPRESSION: CURLEE BOGAN is a 80 y.o. male with a history of    PLAN:  1.  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 --We will complete staging with CT of chest without contrast to assess for additional evidence of disease.  We reviewed imaging including the CT of abdomen/pelvis and MRI consistent with large liver mass with associated intrahepatic portal vein thrombosis. Likely tumor thrombus, we will follow him clinically and if pain increases acutely, we will consider anticoagulation.  -- Given #2 and based radiological impression and despite elevated AFP, we will obtain a liver biopsy to establish diagnosis.  Case discussed briefly with IR.  If consistent with primary [place], we will then refer to IR for consideration of Y-90.  His risk factors are undetermined.  He denies a longstanding history of liver disease and exposures environmentally and chronic  hepatitis.    2. Left renal lesion, complex. --We will complete work up for number #1. He will require close imaging and follow up.   3. 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    4. Follow up.  --Patient will return to clinic in 1-2 weeks with labs prior to these visit.  All questions were answered. The patient knows to call the clinic with any problems, questions or concerns. We can certainly see the patient much sooner if necessary.  I spent 40 minutes counseling the patient face to face. The total time spent in the appointment was 60 minutes.    Sophiah Rolin, MD 09/07/2013 5:04 PM

## 2013-09-06 NOTE — Telephone Encounter (Signed)
gv pt wife appt schedule for july. [facility] will call pt w/pet scan and bx appt. pt wife aware.

## 2013-09-07 ENCOUNTER — Other Ambulatory Visit: Payer: Self-pay | Admitting: Medical Oncology

## 2013-09-08 ENCOUNTER — Telehealth: Payer: Self-pay | Admitting: Internal Medicine

## 2013-09-08 NOTE — Telephone Encounter (Signed)
pet already cxd and ct already on schedule as requested per 3rd 7/14 pof. IR consult to be scheduled per 2nd 7/14 pof.

## 2013-09-09 ENCOUNTER — Ambulatory Visit (INDEPENDENT_AMBULATORY_CARE_PROVIDER_SITE_OTHER): Payer: Medicare Other | Admitting: Family Medicine

## 2013-09-09 ENCOUNTER — Encounter: Payer: Self-pay | Admitting: Family Medicine

## 2013-09-09 ENCOUNTER — Telehealth: Payer: Self-pay | Admitting: Family Medicine

## 2013-09-09 ENCOUNTER — Other Ambulatory Visit (HOSPITAL_BASED_OUTPATIENT_CLINIC_OR_DEPARTMENT_OTHER): Payer: Medicare Other

## 2013-09-09 VITALS — BP 90/52 | HR 74 | Temp 96.9°F | Resp 18 | Ht 74.0 in | Wt 197.0 lb

## 2013-09-09 DIAGNOSIS — I81 Portal vein thrombosis: Secondary | ICD-10-CM

## 2013-09-09 DIAGNOSIS — K769 Liver disease, unspecified: Secondary | ICD-10-CM

## 2013-09-09 DIAGNOSIS — R404 Transient alteration of awareness: Secondary | ICD-10-CM

## 2013-09-09 DIAGNOSIS — K7460 Unspecified cirrhosis of liver: Secondary | ICD-10-CM

## 2013-09-09 DIAGNOSIS — N289 Disorder of kidney and ureter, unspecified: Secondary | ICD-10-CM

## 2013-09-09 DIAGNOSIS — R410 Disorientation, unspecified: Secondary | ICD-10-CM

## 2013-09-09 DIAGNOSIS — C220 Liver cell carcinoma: Secondary | ICD-10-CM

## 2013-09-09 LAB — COMPREHENSIVE METABOLIC PANEL (CC13)
ALT: 85 U/L — ABNORMAL HIGH (ref 0–55)
AST: 507 U/L — AB (ref 5–34)
Albumin: 2.7 g/dL — ABNORMAL LOW (ref 3.5–5.0)
Alkaline Phosphatase: 266 U/L — ABNORMAL HIGH (ref 40–150)
Anion Gap: 9 mEq/L (ref 3–11)
BILIRUBIN TOTAL: 1.24 mg/dL — AB (ref 0.20–1.20)
BUN: 33.3 mg/dL — ABNORMAL HIGH (ref 7.0–26.0)
CALCIUM: 9.5 mg/dL (ref 8.4–10.4)
CHLORIDE: 103 meq/L (ref 98–109)
CO2: 27 mEq/L (ref 22–29)
CREATININE: 1.2 mg/dL (ref 0.7–1.3)
Glucose: 129 mg/dl (ref 70–140)
Potassium: 4.5 mEq/L (ref 3.5–5.1)
Sodium: 138 mEq/L (ref 136–145)
Total Protein: 6.3 g/dL — ABNORMAL LOW (ref 6.4–8.3)

## 2013-09-09 LAB — CBC WITH DIFFERENTIAL/PLATELET
BASO%: 0.7 % (ref 0.0–2.0)
Basophils Absolute: 0.1 10*3/uL (ref 0.0–0.1)
EOS ABS: 0.2 10*3/uL (ref 0.0–0.5)
EOS%: 2.6 % (ref 0.0–7.0)
HCT: 34.3 % — ABNORMAL LOW (ref 38.4–49.9)
HGB: 11.4 g/dL — ABNORMAL LOW (ref 13.0–17.1)
LYMPH#: 0.9 10*3/uL (ref 0.9–3.3)
LYMPH%: 11.7 % — ABNORMAL LOW (ref 14.0–49.0)
MCH: 32.3 pg (ref 27.2–33.4)
MCHC: 33 g/dL (ref 32.0–36.0)
MCV: 97.7 fL (ref 79.3–98.0)
MONO#: 1 10*3/uL — ABNORMAL HIGH (ref 0.1–0.9)
MONO%: 12.8 % (ref 0.0–14.0)
NEUT%: 72.2 % (ref 39.0–75.0)
NEUTROS ABS: 5.4 10*3/uL (ref 1.5–6.5)
Platelets: 214 10*3/uL (ref 140–400)
RBC: 3.51 10*6/uL — ABNORMAL LOW (ref 4.20–5.82)
RDW: 14.9 % — ABNORMAL HIGH (ref 11.0–14.6)
WBC: 7.4 10*3/uL (ref 4.0–10.3)

## 2013-09-09 LAB — PROTIME-INR
INR: 1 — ABNORMAL LOW (ref 2.00–3.50)
Protime: 12 Seconds (ref 10.6–13.4)

## 2013-09-09 NOTE — Telephone Encounter (Signed)
Pt wife Stanton Kidney had called and I spoke with her this morning she was calling to update [place] on how Mr. Talarico was doing. She states that they were not impressed with the oncologist we sent them to, however; they are going to be seeing another oncologist and they have not met that one yet. She states he is going down hill since last week.  The oncologist she states is setting up procedures and then they cancel them, and she is not understanding why they are doing that. PT is hearing and seeing things and having crazy dreams. She states he is also talking nonsense. And is still having ear problems. Also she stated that the pt is in denial and believe once he has a surgery that he will go back to normal, and she is wanting to know if you could talk to him. The wife is also wanting a brain scan on the pt.    The number for where the wife can be reached is [phone]

## 2013-09-09 NOTE — Telephone Encounter (Signed)
Have them come by today at 4:30 for OV to discuss

## 2013-09-09 NOTE — Progress Notes (Signed)
Subjective:    Patient ID: Lonnie Barron, male    DOB: 25-Jul-1928, 80 y.o.   MRN: [id number]  HPI  Patient is currently being evaluated by oncology for hepatocellular carcinoma. Over the last 2 weeks he is declined rapidly. He is delirious at times. He is having auditory and visual hallucinations. He is hypersomnolent. He is confused and falling frequently. His blood pressure is dangerously low. He is on numerous antihypertensive medications. He is also on numerous mood altering substances that can cause delirium and falls including Cymbalta and gabapentin. In particular concerned about Cymbalta due to its risk in people with cirrhosis. He also continues to take Klonopin for anxiety. On evaluation today there is no evidence of hepatic encephalopathy. The patient has no asterixis. There is no visual jaundice although the patient is gaunt.   Past Medical History  Diagnosis Date  . Hypertension   . PAC (premature atrial contraction)   . PVC's (premature ventricular contractions)   . Tobacco dependence   . Diabetes mellitus   . Hyperlipidemia   . Prostate cancer   . Osteoarthritis   . Peripheral neuropathy   . Complication of anesthesia     ?, became limp after surgery -was hospitalized  . COPD (chronic obstructive pulmonary disease)    Current Outpatient Prescriptions on File Prior to Visit  Medication Sig Dispense Refill  . amLODipine (NORVASC) 5 MG tablet TAKE 2 TABLET EVERY DAY      . aspirin EC 81 MG tablet Take 81 mg by mouth daily.      . clonazePAM (KLONOPIN) 0.5 MG tablet TAKE 1 TABLET BY MOUTH EVERY DAY AS NEEDED  30 tablet  2  . Cyanocobalamin (VITAMIN B12 PO) Take by mouth daily.      . DULoxetine (CYMBALTA) 60 MG capsule TAKE ONE CAPSULE BY MOUTH EVERY DAY  30 capsule  11  . gabapentin (NEURONTIN) 600 MG tablet TAKE 1 TABLET 4 TIMES DAILY  120 tablet  4  . losartan (COZAAR) 100 MG tablet TAKE 1 TABLET EVERY DAY  30 tablet  4  . meclizine (ANTIVERT) 25 MG tablet Take 1  tablet (25 mg total) by mouth every 8 (eight) hours. PRN dizziness  30 tablet  0  . metoprolol succinate (TOPROL-XL) 50 MG 24 hr tablet TAKE 1 TABLET EVERY DAY  30 tablet  5  . nitroGLYCERIN (NITROSTAT) 0.4 MG SL tablet Place 1 tablet (0.4 mg total) under the tongue every 5 (five) minutes as needed for chest pain.  25 tablet  2  . Omega-3 Fatty Acids (FISH OIL) 1200 MG CAPS Take 1 capsule by mouth 4 (four) times daily.      Marland Kitchen OVER THE COUNTER MEDICATION Take 3 tablets by mouth daily. Formula 303. Muscle relaxer.      Marland Kitchen SPIRIVA HANDIHALER 18 MCG inhalation capsule INHALE CONTENTS OF 1 CAPSULE ONCE DAILY AS DIRECTED  30 capsule  11  . traMADol (ULTRAM) 50 MG tablet TAKE 2 TABLETS BY MOUTH 3 TIMES A DAY AS NEEDED FOR PAIN  180 tablet  2  . VOLTAREN 1 % GEL Apply 1 application topically. To knees      . albuterol (PROVENTIL HFA;VENTOLIN HFA) 108 (90 BASE) MCG/ACT inhaler Inhale 2 puffs into the lungs every 4 (four) hours as needed for wheezing or shortness of breath.  1 Inhaler  0   No current facility-administered medications on file prior to visit.   Allergies  Allergen Reactions  . Ciprofloxacin Hives, Shortness Of Breath and Itching  .  Codeine Anxiety   History   Social History  . Marital Status: Married    Spouse Name: N/A    Number of Children: 3  . Years of Education: N/A   Occupational History  . retired    Social History Main Topics  . Smoking status: Current Every Day Smoker -- 1.50 packs/day for 65 years  . Smokeless tobacco: Never Used  . Alcohol Use: No  . Drug Use: No  . Sexual Activity: No   Other Topics Concern  . Not on file   Social History Narrative  . No narrative on file     Review of Systems  All other systems reviewed and are negative.      Objective:   Physical Exam  Vitals reviewed. Constitutional: He is oriented to person, place, and time.  Cardiovascular: Normal rate and regular rhythm.   Pulmonary/Chest: Effort normal and breath sounds  normal.  Abdominal: Soft. Bowel sounds are normal.  Musculoskeletal: He exhibits no edema.  Neurological: He is alert and oriented to person, place, and time. He has normal reflexes. No cranial nerve deficit. He exhibits normal muscle tone. Coordination normal.   no asterixis        Assessment & Plan:  Cirrhosis of liver without mention of alcohol - Plan: AMMONIA, CT Head W Contrast  Delirium - Plan: CT Head W Contrast   Schedule patient for a head CT to rule out metastasis to the brain. Also check an ammonia level to rule out hepatic encephalopathy. However I believe the patient's falls, altered mental status, and delirium as a combination of polypharmacy, hypotension, and acute illness. Therefore I'm going to discontinue losartan. I am also going to discontinue amlodipine. Hopefully this will help address his hypotension. I will also have the patient wean off Cymbalta. He was decreased to 60 mg every other day for one week and then 60 mg every third day for one week and then discontinue the medication altogether. I also decreased the gabapentin to 300 mg 4 times a day. Recheck on Monday. We may need to wean down further on the gabapentin and also possibly discontinue the Klonopin gradually. Hopefully decreasing some of these medications will help the patient's balance and cognition.

## 2013-09-10 ENCOUNTER — Telehealth: Payer: Self-pay | Admitting: Hematology and Oncology

## 2013-09-10 ENCOUNTER — Telehealth: Payer: Self-pay | Admitting: Internal Medicine

## 2013-09-10 LAB — AMMONIA: Ammonia: 69 umol/L — ABNORMAL HIGH (ref 16–53)

## 2013-09-10 NOTE — Telephone Encounter (Signed)
S/W WIFE RE APPT FOR 7/24 AND CONFIRMED THAT [facility] DID CONTACT HER RE APPT FOR CT. LMONVM FOR TAMI AT [facility] RE Y90 TX. REFERRAL IN EPIC - TAMI WILL CALL PT. WIFE INFORMED TO BE EXPECTED A CALL RE THE Y90 CONSULT.

## 2013-09-10 NOTE — Telephone Encounter (Signed)
ADDED PT FOR IVF'S TOMORROW @ 10AM PER LATE INF NURSE (CAMEO). PER NURSE SHE WILL GIVEN PT APPT AND ADD PT INFORMATIION TO INF SCHEDULE FOR TOMORROW.

## 2013-09-13 ENCOUNTER — Other Ambulatory Visit: Payer: Self-pay | Admitting: Medical Oncology

## 2013-09-13 ENCOUNTER — Telehealth: Payer: Self-pay | Admitting: *Deleted

## 2013-09-13 DIAGNOSIS — C220 Liver cell carcinoma: Secondary | ICD-10-CM

## 2013-09-13 DIAGNOSIS — Z9181 History of falling: Secondary | ICD-10-CM

## 2013-09-13 MED ORDER — OXYCODONE HCL 5 MG PO TABS: 5.0000 mg | ORAL_TABLET | Freq: Four times a day (QID) | ORAL | Status: DC | PRN

## 2013-09-13 NOTE — Telephone Encounter (Signed)
Okay with right rib xray.  Due to liver inflammation I would recommend oxycodone 5 mg poq 6 hrs prn pain (30).

## 2013-09-13 NOTE — Telephone Encounter (Signed)
Xray ordered and pt aware as well as script printed out for provider signature.

## 2013-09-13 NOTE — Telephone Encounter (Signed)
Wife called stating that pt fell last Wednesday and now is complaining of ribs hurting on right side near liver wants to know if he can go for xray to rule out broken ribs(?) and also wants to know if can get something for pain, states she had given him tramadol 50mg  TID this weekend to ease pain. Md Please advise!

## 2013-09-14 ENCOUNTER — Ambulatory Visit
Admission: RE | Admit: 2013-09-14 | Discharge: 2013-09-14 | Disposition: A | Discharge status: Home / Self Care | Payer: Medicare Other | Source: Ambulatory Visit | Attending: Family Medicine | Admitting: Family Medicine

## 2013-09-14 ENCOUNTER — Other Ambulatory Visit: Payer: Self-pay | Admitting: Family Medicine

## 2013-09-14 DIAGNOSIS — Z9181 History of falling: Secondary | ICD-10-CM

## 2013-09-14 DIAGNOSIS — R410 Disorientation, unspecified: Secondary | ICD-10-CM

## 2013-09-14 DIAGNOSIS — K7460 Unspecified cirrhosis of liver: Secondary | ICD-10-CM

## 2013-09-14 MED ORDER — IOHEXOL 300 MG/ML  SOLN
75.0000 mL | Freq: Once | INTRAMUSCULAR | Status: AC | PRN
  Administered 2013-09-14: 75 mL via INTRAVENOUS

## 2013-09-15 ENCOUNTER — Ambulatory Visit (HOSPITAL_COMMUNITY): Payer: Medicare Other

## 2013-09-15 ENCOUNTER — Ambulatory Visit (HOSPITAL_COMMUNITY)
Admission: RE | Admit: 2013-09-15 | Discharge: 2013-09-15 | Disposition: A | Discharge status: Home / Self Care | Payer: Medicare Other | Source: Ambulatory Visit | Attending: Internal Medicine | Admitting: Internal Medicine

## 2013-09-15 ENCOUNTER — Encounter (HOSPITAL_COMMUNITY): Payer: Self-pay

## 2013-09-15 ENCOUNTER — Telehealth: Payer: Self-pay | Admitting: Family Medicine

## 2013-09-15 DIAGNOSIS — J984 Other disorders of lung: Secondary | ICD-10-CM | POA: Insufficient documentation

## 2013-09-15 DIAGNOSIS — K7460 Unspecified cirrhosis of liver: Secondary | ICD-10-CM | POA: Diagnosis not present

## 2013-09-15 DIAGNOSIS — C220 Liver cell carcinoma: Secondary | ICD-10-CM

## 2013-09-15 DIAGNOSIS — C61 Malignant neoplasm of prostate: Secondary | ICD-10-CM | POA: Insufficient documentation

## 2013-09-15 DIAGNOSIS — J438 Other emphysema: Secondary | ICD-10-CM | POA: Insufficient documentation

## 2013-09-15 DIAGNOSIS — R634 Abnormal weight loss: Secondary | ICD-10-CM | POA: Diagnosis not present

## 2013-09-15 DIAGNOSIS — K769 Liver disease, unspecified: Secondary | ICD-10-CM | POA: Insufficient documentation

## 2013-09-15 HISTORY — DX: Malignant neoplasm of liver, not specified as primary or secondary: C22.9

## 2013-09-15 NOTE — Telephone Encounter (Signed)
Pt is in a lot of pain and would like to know what else he can take for his pain. Spoke to his son and he is up a lot at night b/c of the pain and complaining with pain in different body parts ie head, back, rib area. Cleared with MBD for him to take 2 5 mg oxycodone tabs po q 6 hours until I talked with you about his pain.

## 2013-09-16 ENCOUNTER — Encounter (HOSPITAL_COMMUNITY): Payer: Self-pay | Admitting: Pharmacy Technician

## 2013-09-16 ENCOUNTER — Other Ambulatory Visit: Payer: Self-pay | Admitting: Radiology

## 2013-09-16 NOTE — Telephone Encounter (Signed)
10 mg every 4 hours is possible.  Did 10 q 6 hrs help?

## 2013-09-16 NOTE — Telephone Encounter (Signed)
Tried to call no answer and no vm 

## 2013-09-17 ENCOUNTER — Ambulatory Visit (HOSPITAL_BASED_OUTPATIENT_CLINIC_OR_DEPARTMENT_OTHER): Payer: Medicare Other | Admitting: Internal Medicine

## 2013-09-17 ENCOUNTER — Ambulatory Visit (HOSPITAL_BASED_OUTPATIENT_CLINIC_OR_DEPARTMENT_OTHER): Payer: Medicare Other

## 2013-09-17 ENCOUNTER — Telehealth: Payer: Self-pay | Admitting: Internal Medicine

## 2013-09-17 VITALS — BP 154/94 | HR 68 | Temp 97.6°F | Resp 18 | Ht 74.0 in | Wt 192.2 lb

## 2013-09-17 DIAGNOSIS — K769 Liver disease, unspecified: Secondary | ICD-10-CM

## 2013-09-17 DIAGNOSIS — C220 Liver cell carcinoma: Secondary | ICD-10-CM

## 2013-09-17 DIAGNOSIS — R7989 Other specified abnormal findings of blood chemistry: Secondary | ICD-10-CM

## 2013-09-17 DIAGNOSIS — I81 Portal vein thrombosis: Secondary | ICD-10-CM

## 2013-09-17 DIAGNOSIS — R634 Abnormal weight loss: Secondary | ICD-10-CM

## 2013-09-17 DIAGNOSIS — N289 Disorder of kidney and ureter, unspecified: Secondary | ICD-10-CM

## 2013-09-17 LAB — COMPREHENSIVE METABOLIC PANEL (CC13)
ALBUMIN: 2.7 g/dL — AB (ref 3.5–5.0)
ALT: 52 U/L (ref 0–55)
ANION GAP: 10 meq/L (ref 3–11)
AST: 439 U/L (ref 5–34)
Alkaline Phosphatase: 298 U/L — ABNORMAL HIGH (ref 40–150)
BUN: 24.8 mg/dL (ref 7.0–26.0)
CALCIUM: 9.7 mg/dL (ref 8.4–10.4)
CHLORIDE: 103 meq/L (ref 98–109)
CO2: 23 meq/L (ref 22–29)
CREATININE: 1.1 mg/dL (ref 0.7–1.3)
GLUCOSE: 105 mg/dL (ref 70–140)
POTASSIUM: 4.6 meq/L (ref 3.5–5.1)
Sodium: 136 mEq/L (ref 136–145)
Total Bilirubin: 1.51 mg/dL — ABNORMAL HIGH (ref 0.20–1.20)
Total Protein: 6.6 g/dL (ref 6.4–8.3)

## 2013-09-17 LAB — CBC WITH DIFFERENTIAL/PLATELET
BASO%: 0.9 % (ref 0.0–2.0)
BASOS ABS: 0.1 10*3/uL (ref 0.0–0.1)
EOS ABS: 0.3 10*3/uL (ref 0.0–0.5)
EOS%: 4.4 % (ref 0.0–7.0)
HEMATOCRIT: 41.2 % (ref 38.4–49.9)
HEMOGLOBIN: 13.6 g/dL (ref 13.0–17.1)
LYMPH#: 0.8 10*3/uL — AB (ref 0.9–3.3)
LYMPH%: 9.6 % — ABNORMAL LOW (ref 14.0–49.0)
MCH: 32.4 pg (ref 27.2–33.4)
MCHC: 32.9 g/dL (ref 32.0–36.0)
MCV: 98.4 fL — ABNORMAL HIGH (ref 79.3–98.0)
MONO#: 0.7 10*3/uL (ref 0.1–0.9)
MONO%: 8.5 % (ref 0.0–14.0)
NEUT#: 6.1 10*3/uL (ref 1.5–6.5)
NEUT%: 76.6 % — ABNORMAL HIGH (ref 39.0–75.0)
Platelets: 271 10*3/uL (ref 140–400)
RBC: 4.19 10*6/uL — ABNORMAL LOW (ref 4.20–5.82)
RDW: 15.2 % — AB (ref 11.0–14.6)
WBC: 8 10*3/uL (ref 4.0–10.3)

## 2013-09-17 LAB — LACTATE DEHYDROGENASE (CC13): LDH: 483 U/L — AB (ref 125–245)

## 2013-09-17 MED ORDER — OXYCODONE HCL ER 15 MG PO T12A: 15.0000 mg | EXTENDED_RELEASE_TABLET | Freq: Two times a day (BID) | ORAL | Status: AC

## 2013-09-17 NOTE — Telephone Encounter (Signed)
gv and printed appt sched and avs fo rpt for Aug...sent pt to lab

## 2013-09-17 NOTE — Progress Notes (Signed)
[facility] OFFICE PROGRESS NOTE  Lonnie Fraction, MD [address] [place] [address]  DIAGNOSIS: [place] (hepatocellular carcinoma) - Plan: CBC with Differential, Comprehensive metabolic panel (Cmet) - [facility], Lactate dehydrogenase (LDH) - [facility]  Chief Complaint  Patient presents with  . Follow-up    CURRENT TREATMENT: Planning Y-90 per interventional radiology. Ultrasound guided biopsy on 09/20/2013.   INTERVAL HISTORY: Lonnie Barron 80 y.o. male 80 y.o. male who was referred for further evaluation of his newly diagnosed [place] by imaging and seen initially on 09/06/2013. He is here for follow up.   He was referred by Dr. Jenna Luo. He was last seen by Dr. Dennard Schaumann on 08/20/2013. He had a few weeks of epigastric abdominal pain and sharp occasional right upper quandrant abdominal pain. He denied any melena or hematochezia and notes a piror cholecystectomy about 10 years ago. As part of his abdominal pain workup, he had CT abdomen pelvis with contrast and was referred to gastroenterology. His CT of abdomen pelvis with constrast on 08/24/2013 demonstrated a mass superioly within the right lobe of the liver, 6.9 x 4.9 x 6.8 cm, highly suspicous for hepatocellular carcinoma in the setting of a suspected cirrhotic liver. In addition, thrombus was identified with in left portal vein and occluding the right portal vein, likely representing tumor thrombus thou bland thrombus was not excludeed. It also revealed an enhancing nodule at lateral left Barron 1.8 x 1.7 cm suspcious for a small renal neoplasm. An MRI was recommended. It was obtained on 08/29/2013, revealing a large complex liver mass involving almost the entire right hepatic lobe and small associated lesions in the medial segment of the left hepatic lobe. This was most likely a hepatocellular carcinoma. 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 A biopsy was recommended. There were bordeline enlarged  upper abdominal lymph nodes. There were simple and complex cysts associated with the left Barron. His AFP was 1,463 on 08/31/2013. His acute hepatitis panel was negative on 08/31/2013. His LFTs on 08/20/2013 revealed AST of 270, ALT of 56, and albumin of 4. And total bilirubin of 0.8 and creatinine of 1.04.   Today, he is accompanied by his wife Lonnie Barron and his grandson. He had an interim CT of chest which was negative for malignancy.  He is scheduled for an ultrasound of the liver next Monday.  He is taking oxycodone 10 mg every 6 hours as needed for the pain  With moderate relief.  He denies any hospitalizations or emergency room visits.   MEDICAL HISTORY: Past Medical History  Diagnosis Date  . Hypertension   . PAC (premature atrial contraction)   . PVC's (premature ventricular contractions)   . Tobacco dependence   . Diabetes mellitus   . Hyperlipidemia   . Osteoarthritis   . Peripheral neuropathy   . Complication of anesthesia     ?, became limp after surgery -was hospitalized  . COPD (chronic obstructive pulmonary disease)   . Prostate cancer dx'd 2012    xrt  . Liver cancer dx'd 08/2013    w/ prob renal cell ca    INTERIM HISTORY: has Hypertension; PAC (premature atrial contraction); PVC's (premature ventricular contractions); Tobacco dependence; Hyperlipidemia; COPD with exacerbation; Bronchitis, acute; Weakness generalized; Gait disorder; Syncope; Nodule of parotid gland; and HCC (hepatocellular carcinoma) on his problem list.    ALLERGIES:  is allergic to ciprofloxacin and codeine.  MEDICATIONS: has a current medication list which includes the following prescription(s): albuterol, clonazepam, duloxetine, gabapentin, metoprolol  succinate, nitroglycerin, fish oil, OVER THE COUNTER MEDICATION, oxycodone, tiotropium, tramadol, vitamin b-12, voltaren, albuterol, and oxycodone.  SURGICAL HISTORY:  Past Surgical History  Procedure Laterality Date  . Replacement total knee    . Total  shoulder replacement    . Cholecystectomy    . Cardiovascular stress test  03/27/2007    EF 51%    REVIEW OF SYSTEMS:   Constitutional: Denies fevers, chills or abnormal weight loss Eyes: Denies blurriness of vision Ears, nose, mouth, throat, and face: Denies mucositis or sore throat Respiratory: Denies cough, dyspnea or wheezes Cardiovascular: Denies palpitation, chest discomfort or lower extremity swelling Gastrointestinal:  Denies nausea, heartburn or change in bowel habits Skin: Denies abnormal skin rashes Lymphatics: Denies new lymphadenopathy or easy bruising Neurological:Denies numbness, tingling or new weaknesses Behavioral/Psych: Mood is stable, no new changes  All other systems were reviewed with the patient and are negative.  PHYSICAL EXAMINATION: ECOG PERFORMANCE STATUS: 2 - Symptomatic, <50% confined to bed  Blood pressure 154/94, pulse 68, temperature 97.6 F (36.4 C), temperature source Oral, resp. rate 18, height 6\' 2"  (1.88 m), weight 192 lb 3.2 oz (87.181 kg), SpO2 99.00%.  ECOG 2:  GENERAL:alert, no distress and comfortable; chronically ill appearing sitting in wheelchair  SKIN: skin color, texture, turgor are normal, no rashes or significant lesions  EYES: normal, Conjunctiva are pink and non-injected, sclera clear  OROPHARYNX:no exudate, no erythema and lips, buccal mucosa, and tongue normal  NECK: supple, thyroid normal size, non-tender, without nodularity  LYMPH: no palpable lymphadenopathy in the cervical, axillary or inguinal  LUNGS: clear to auscultation and percussion with normal breathing effort  HEART: regular rate & rhythm and no murmurs and no lower extremity edema  ABDOMEN:abdomen soft, Tender to palpation in RUQ and normal bowel sounds; No stigmata of liver disease.  Musculoskeletal:no cyanosis of digits and no clubbing  NEURO: alert & oriented x 3 with fluent speech, no focal motor/sensory deficits; no asterexis.   Labs:  Lab Results  Component  Value Date   WBC 8.0 09/17/2013   HGB 13.6 09/17/2013   HCT 41.2 09/17/2013   MCV 98.4* 09/17/2013   PLT 271 09/17/2013   NEUTROABS 6.1 09/17/2013      Chemistry      Component Value Date/Time   NA 136 09/17/2013 1421   NA 141 08/20/2013 1011   K 4.6 09/17/2013 1421   K 4.3 08/20/2013 1011   CL 101 08/20/2013 1011   CO2 23 09/17/2013 1421   CO2 26 08/20/2013 1011   BUN 24.8 09/17/2013 1421   BUN 19 08/20/2013 1011   CREATININE 1.1 09/17/2013 1421   CREATININE 1.04 08/20/2013 1011   CREATININE 1.1 05/13/2011 0940      Component Value Date/Time   CALCIUM 9.7 09/17/2013 1421   CALCIUM 9.8 08/20/2013 1011   ALKPHOS 298* 09/17/2013 1421   ALKPHOS 202* 08/20/2013 1011   AST 439* 09/17/2013 1421   AST 270* 08/20/2013 1011   ALT 52 09/17/2013 1421   ALT 56* 08/20/2013 1011   BILITOT 1.51* 09/17/2013 1421   BILITOT 0.8 08/20/2013 1011       Basic Metabolic Panel:  Recent Labs Lab 09/17/13 1421  NA 136  K 4.6  CO2 23  GLUCOSE 105  BUN 24.8  CREATININE 1.1  CALCIUM 9.7   GFR Estimated Creatinine Clearance: 59.2 ml/min (by C-G formula based on Cr of 1.1). Liver Function Tests:  Recent Labs Lab 09/17/13 1421  AST 439*  ALT 52  ALKPHOS 298*  BILITOT 1.51*  PROT 6.6  ALBUMIN 2.7*   No results found for this basename: LIPASE, AMYLASE,  in the last 168 hours No results found for this basename: AMMONIA,  in the last 168 hours Coagulation profile No results found for this basename: INR, PROTIME,  in the last 168 hours  CBC:  Recent Labs Lab 09/17/13 1420  WBC 8.0  NEUTROABS 6.1  HGB 13.6  HCT 41.2  MCV 98.4*  PLT 271    Anemia work up No results found for this basename: VITAMINB12, FOLATE, FERRITIN, TIBC, IRON, RETICCTPCT,  in the last 72 hours  Studies:  No results found.   RADIOGRAPHIC STUDIES: Dg Ribs Unilateral W/chest Right  09/14/2013   CLINICAL DATA:  Right anterior lateral rib pain status post fall 6 days ago; history of COPD and pancreatic malignancy  EXAM:  RIGHT RIBS AND CHEST - 3+ VIEW  COMPARISON:  PA and lateral chest of January 06, 2012  FINDINGS: The lungs are adequately inflated. There is new subsegmental linear density peripherally in the left lower hemithorax. There is no pneumothorax or pleural effusion. The heart and pulmonary vascularity are normal.  A metallic BB was placed over the lower right anterior rib cage. Deep to this no acute fracture is demonstrated. There is no pleural effusion or pneumothorax. There is degenerative change of the lumbar spine. There is contrast within the renal collecting systems bilaterally.  IMPRESSION: 1. There is no acute rib fracture, pneumothorax, or pleural effusion. 2. There is new atelectasis versus scarring at the left lung base.   Electronically Signed   By: Chandel Zaun  [place]   On: 09/14/2013 14:05   Ct Head W Wo Contrast  09/14/2013   CLINICAL DATA:  Recent falls. Delirium. Confusion. Dizziness. New diagnosis of liver carcinoma. Prostate cancer.  EXAM: CT HEAD WITHOUT AND WITH CONTRAST  TECHNIQUE: Contiguous axial images were obtained from the base of the skull through the vertex without and with intravenous contrast  CONTRAST:  67mL OMNIPAQUE IOHEXOL 300 MG/ML  SOLN  COMPARISON:  04/19/2011 MR. 05/31/2008 CT.  FINDINGS: No skull fracture or intracranial hemorrhage.  Moderate to marked small vessel disease type changes without CT evidence of large acute infarct.  Sub cm right frontal calcified meningioma suspected change. No associated mass effect. Otherwise no evidence of intracranial mass or abnormal enhancement.  Prominent vascular calcifications.  Metallic BBs within right parietal scalp.  IMPRESSION: No skull fracture or intracranial hemorrhage.  Moderate to marked small vessel disease type changes without CT evidence of large acute infarct.  Sub cm right frontal calcified meningioma without change. No associated mass effect. Otherwise no evidence of intracranial mass or abnormal enhancement.  Prominent vascular  calcifications.  Metallic BBs within right parietal scalp.   Electronically Signed   By: Chauncey Cruel M.D.   On: 09/14/2013 12:57   Ct Chest Wo Contrast  (Reviewed personally by me).   09/15/2013   CLINICAL DATA:  Prostate carcinoma. Liver mass. Weight loss. Evaluate for thoracic primary or metastatic disease.  EXAM: CT CHEST WITHOUT CONTRAST  TECHNIQUE: Multidetector CT imaging of the chest was performed following the standard protocol without IV contrast.  COMPARISON:  06/01/2008  FINDINGS: No evidence of hilar or mediastinal masses. No lymphadenopathy identified elsewhere within the thorax. No evidence of pleural or pericardial effusion.  Mild emphysema is noted. No suspicious pulmonary nodules or masses are identified. Mild scarring is seen in the lateral left lung base. No evidence of pulmonary airspace disease or central endobronchial obstruction. No  evidence of chest wall mass or suspicious bone lesions.  Hepatic cirrhosis and multiple liver masses are again seen, as better demonstrated on recent abdomen CT of 08/24/2013.  IMPRESSION: No evidence of neoplasm or other active disease in the thorax.  Mild emphysema.   Electronically Signed   By: Earle Gell M.D.   On: 09/15/2013 15:37   Mr Abdomen W Wo Contrast  08/29/2013   CLINICAL DATA:  Liver mass.  EXAM: MRI ABDOMEN WITHOUT AND WITH CONTRAST  TECHNIQUE: Multiplanar multisequence MR imaging of the abdomen was performed both before and after the administration of intravenous contrast.  CONTRAST:  9 cc Eovist  COMPARISON:  CT scan 08/24/2013  FINDINGS: As demonstrated on the CT scan there is a large complex mass involving almost the entire right hepatic lobe and small associated lesions in the medial segment of the left hepatic lobe. This is most likely a hepatocellular carcinoma. Also demonstrated on the CT scan is portal vein thrombosis. The middle and right portal veins are not visualized and likely occluded with tumor/clot. There is also tumor  thrombus in the left portal vein but it is not completely occluded. The main portal vein is patent as is the splenic vein.  There are borderline enlarged portal, celiac axis and retroperitoneal lymph nodes. The adrenal glands are normal. There are bilateral simple appearing renal cysts and a complex cyst associated with the lower pole region of the left Barron which appears hemorrhagic.  The aorta and branch vessels are patent. There are degenerative changes involving the spine but no destructive bone lesions.  IMPRESSION: 1. Large complex liver mass as described above. There is associated intrahepatic portal vein thrombosis. Recommend biopsy. 2. Borderline enlarged upper abdominal lymph nodes. 3. Simple and complex cysts associated with the left Barron.   Electronically Signed   By: Kalman Jewels M.D.   On: 08/29/2013 19:53   Ct Abdomen Pelvis W Contrast  08/24/2013   CLINICAL DATA:  30 pounds weight loss in 5 months, anorexia, mid abdominal pain, occasional blood in stool, history prostate cancer, renal calculi, hypertension, smoking, diabetes, COPD  EXAM: CT ABDOMEN AND PELVIS WITH CONTRAST  TECHNIQUE: Multidetector CT imaging of the abdomen and pelvis was performed using the standard protocol following bolus administration of intravenous contrast. Sagittal and coronal MPR images reconstructed from axial data set.  CONTRAST:  161mL OMNIPAQUE IOHEXOL 300 MG/ML SOLN IV. Dilute oral contrast.  COMPARISON:  10/15/2005  FINDINGS: Lung bases clear.  Filling defect/thrombus identified in LEFT portal vein with occlusion of RIGHT portal vein.  Splenic and superior mesenteric veins patent.  Heterogeneous attenuation of RIGHT lobe of liver may be related to portal vein thrombosis. Additionally, larger area of heterogeneous decreased attenuation is seen superiorly in the RIGHT lobe at dome extending to the central RIGHT lobe, up to 6.9 x 4.9 cm image 15 and 6.8 cm length, highly suspicious for neoplasm.  Enlargement of  lateral segment LEFT lobe liver and caudate lobe suggesting underlying cirrhosis.  Spleen mildly enlarged, 14.1 cm length.  No focal abnormalities of the spleen, pancreas, or adrenal glands.  Tiny exophytic nodule upper pole RIGHT Barron 8 mm diameter image 30, indeterminate, previously 5 mm.  Small LEFT renal cysts.  Additional intermediate attenuation nodule exophytic at lateral LEFT Barron image 36, 1.8 x 1.7 cm, demonstrating washout on delayed images question neoplasm.  Scattered atherosclerotic calcifications aorta.  Stomach incompletely distended, unable to adequately assess wall thickness.  Normal appendix.  Large and small bowel loops otherwise normal  appearance.  Surgical clips versus seeds at the prostate bed.  Bladder and ureters unremarkable.  No additional mass, adenopathy, free fluid or inflammatory process.  Multilevel degenerative disc and facet disease changes lumbar spine.  IMPRESSION: Mass superiorly within the RIGHT lobe of the liver, 6.9 x 4.9 x 6.8 cm, highly suspicious for hepatocellular carcinoma in the setting of a suspected cirrhotic liver.  Thrombus identified with in LEFT portal vein and occluding the RIGHT portal vein, likely representing tumor thrombus though bland thrombus is not excluded.  Recommend MR imaging with and without contrast for further assessment.  Enhancing nodule at lateral LEFT Barron 1.8 x 1.7 cm suspicious for a small renal neoplasm.  Findings called to Dr. Dennard Schaumann on 08/24/2013 at 1625 hours.   Electronically Signed   By: Lavonia Dana M.D.   On: 08/24/2013 16:28    ASSESSMENT: HADRIEL NORTHUP 80 y.o. male with a history of HCC (hepatocellular carcinoma) - Plan: CBC with Differential, Comprehensive metabolic panel (Cmet) - [facility], Lactate dehydrogenase (LDH) - [facility]   PLAN:  1. 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  --We completed staging with CT of chest without contrast to assess for additional evidence of disease which was  negative. Last visit, we reviewed imaging including the CT of abdomen/pelvis and MRI consistent with large liver mass with associated intrahepatic portal vein thrombosis. Likely tumor thrombus, we will follow him clinically and if pain increases acutely, we will consider anticoagulation.  -- Given #2 and based radiological impression and despite elevated AFP, we will obtain a liver biopsy to establish diagnosis. It is scheduled for 07/27.  He will then see IR for consideration of y-90.   His risk factors for [facility] are undetermined. He denies a longstanding history of liver disease and exposures environmentally and chronic hepatitis.   2. Left renal lesion, complex.  --We will complete work up for number #1. He will require close imaging and follow up.   3. 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   4. Elevated AST.  --AST 439 today.  5.  Follow up.  --Patient will return to clinic in 2 weeks with labs prior to these visit.  All questions were answered. The patient knows to call the clinic with any problems, questions or concerns. We can certainly see the patient much sooner if necessary.  I spent 15 minutes counseling the patient face to face. The total time spent in the appointment was 25 minutes.    Monette Omara, MD 09/17/2013 3:34 PM

## 2013-09-17 NOTE — Patient Instructions (Signed)
Oxycodone extended-release tablets What is this medicine? OXYCODONE (ox i KOE done) is a pain reliever. It is used to treat constant pain that lasts for more than a few days. This medicine may be used for other purposes; ask your health care provider or pharmacist if you have questions. COMMON BRAND NAME(S): OxyContin What should I tell my health care provider before I take this medicine? They need to know if you have any of these conditions: -Addison's disease -brain tumor -drug abuse or addiction -gallbladder disease -head injury -heart disease -if you frequently drink alcohol-containing drinks -kidney disease or problems going to the bathroom -liver disease -lung disease, asthma, or breathing problems -mental problems -pancreatic disease -seizures -stomach or intestine problems -thyroid disease -trouble swallowing -an unusual or allergic reaction to oxycodone, codeine, hydrocodone, morphine, other medicines, foods, dyes, or preservatives -pregnant or trying to get pregnant -breast-feeding How should I use this medicine? Take this medicine by mouth with a full glass of water. Follow the directions on the prescription label. Do not cut, crush or chew this medicine. Swallow only one tablet at a time. Do not wet, soak, or lick the tablet before you take it. You can take it with or without food. If it upsets your stomach, take it with food. Take your medicine at regular intervals. Do not take it more often than directed. Do not stop taking except on your doctor's advice. A special MedGuide will be given to you by the pharmacist with each prescription and refill. Be sure to read this information carefully each time. Talk to your pediatrician regarding the use of this medicine in children. Special care may be needed. Overdosage: If you think you have taken too much of this medicine contact a poison control center or emergency room at once. NOTE: This medicine is only for you. Do not share  this medicine with others. What if I miss a dose? If you miss a dose, take it as soon as you can. If it is almost time for your next dose, take only that dose. Do not take double or extra doses. What may interact with this medicine? -alcohol -antihistamines -carbamazepine -certain medicines used for nausea like chlorpromazine, droperidol -erythromycin -ketoconazole -medicines for depression, anxiety, or psychotic disturbances -medicines for sleep -muscle relaxants -naloxone -naltrexone -narcotic medicines (opiates) for pain -nilotinib -phenobarbital -phenytoin -rifampin -ritonavir -voriconazole This list may not describe all possible interactions. Give your health care provider a list of all the medicines, herbs, non-prescription drugs, or dietary supplements you use. Also tell them if you smoke, drink alcohol, or use illegal drugs. Some items may interact with your medicine. What should I watch for while using this medicine? Tell your doctor or health care professional if your pain does not go away, if it gets worse, or if you have new or a different type of pain. You may develop tolerance to the medicine. Tolerance means that you will need a higher dose of the medication for pain relief. Tolerance is normal and is expected if you take this medicine for a long time. Do not suddenly stop taking your medicine because you may develop a severe reaction. Your body becomes used to the medicine. This does NOT mean you are addicted. Addiction is a behavior related to getting and using a drug for a non-medical reason. If you have pain, you have a medical reason to take pain medicine. Your doctor will tell you how much medicine to take. If your doctor wants you to stop the medicine, the dose   will be slowly lowered over time to avoid any side effects. You may get drowsy or dizzy when you first start taking the medicine or change doses. Do not drive, use machinery, or do anything that may be dangerous  until you know how the medicine affects you. Stand or sit up slowly. There are different types of narcotic medicines (opiates) for pain. If you take more than one type at the same time, you may have more side effects. Give your health care provider a list of all medicines you use. Your doctor will tell you how much medicine to take. Do not take more medicine than directed. Call emergency for help if you have problems breathing. This medicine will cause constipation. Try to have a bowel movement at least every 2 to 3 days. If you do not have a bowel movement for 3 days, call your doctor or health care professional. You may see empty tablets in your stool. The tablet shell for some brands of this medicine does not dissolve. This is normal. Your mouth may get dry. Drinking water, chewing sugarless gum, or sucking on hard candy may help. See your dentist every 6 months. What side effects may I notice from receiving this medicine? Side effects that you should report to your doctor or health care professional as soon as possible: -allergic reactions like skin rash, itching or hives, swelling of the face, lips, or tongue -breathing problems -confusion -feeling faint or lightheaded, falls -trouble passing urine or change in the amount of urine -trouble swallowing -unusually weak or tired Side effects that usually do not require medical attention (report to your doctor or health care professional if they continue or are bothersome): -constipation -dizziness -dry mouth -itching -nausea, vomiting -stomach pain -tiredness -upset stomach This list may not describe all possible side effects. Call your doctor for medical advice about side effects. You may report side effects to FDA at 1-800-FDA-1088. Where should I keep my medicine? Keep out of the reach of children. This medicine can be abused. Keep your medicine in a safe place to protect it from theft. Do not share this medicine with anyone. Selling or  giving away this medicine is dangerous and against the law. Store at room temperature between 15 and 30 degrees C (59 and 86 degrees F). Protect from light. Keep container tightly closed. This medicine may cause accidental overdose and death if it is taken by other adults, children, or pets. Flush any unused medicine down the toilet to reduce the chance of harm. Do not use the medicine after the expiration date. NOTE: This sheet is a summary. It may not cover all possible information. If you have questions about this medicine, talk to your doctor, pharmacist, or health care provider.  2015, [employer]. (2012-09-15 11:17:54)  

## 2013-09-17 NOTE — Telephone Encounter (Signed)
Pt was given oxycontin to at [facility] with oncologist and as of now pt is doing good with his pain management. Informed wife to call us back if we can help in any way.

## 2013-09-20 ENCOUNTER — Ambulatory Visit (HOSPITAL_COMMUNITY)
Admission: RE | Admit: 2013-09-20 | Discharge: 2013-09-20 | Disposition: A | Discharge status: Home / Self Care | Payer: Medicare Other | Source: Ambulatory Visit | Attending: Internal Medicine | Admitting: Internal Medicine

## 2013-09-20 ENCOUNTER — Encounter (HOSPITAL_COMMUNITY): Payer: Self-pay

## 2013-09-20 DIAGNOSIS — Z8546 Personal history of malignant neoplasm of prostate: Secondary | ICD-10-CM | POA: Insufficient documentation

## 2013-09-20 DIAGNOSIS — R1011 Right upper quadrant pain: Secondary | ICD-10-CM | POA: Insufficient documentation

## 2013-09-20 DIAGNOSIS — Z9089 Acquired absence of other organs: Secondary | ICD-10-CM | POA: Insufficient documentation

## 2013-09-20 DIAGNOSIS — J449 Chronic obstructive pulmonary disease, unspecified: Secondary | ICD-10-CM | POA: Insufficient documentation

## 2013-09-20 DIAGNOSIS — Z79899 Other long term (current) drug therapy: Secondary | ICD-10-CM | POA: Insufficient documentation

## 2013-09-20 DIAGNOSIS — E1142 Type 2 diabetes mellitus with diabetic polyneuropathy: Secondary | ICD-10-CM | POA: Diagnosis not present

## 2013-09-20 DIAGNOSIS — Z96659 Presence of unspecified artificial knee joint: Secondary | ICD-10-CM | POA: Diagnosis not present

## 2013-09-20 DIAGNOSIS — C228 Malignant neoplasm of liver, primary, unspecified as to type: Secondary | ICD-10-CM | POA: Insufficient documentation

## 2013-09-20 DIAGNOSIS — F17200 Nicotine dependence, unspecified, uncomplicated: Secondary | ICD-10-CM | POA: Diagnosis not present

## 2013-09-20 DIAGNOSIS — C220 Liver cell carcinoma: Secondary | ICD-10-CM

## 2013-09-20 DIAGNOSIS — J4489 Other specified chronic obstructive pulmonary disease: Secondary | ICD-10-CM | POA: Insufficient documentation

## 2013-09-20 DIAGNOSIS — E1149 Type 2 diabetes mellitus with other diabetic neurological complication: Secondary | ICD-10-CM | POA: Diagnosis not present

## 2013-09-20 DIAGNOSIS — E785 Hyperlipidemia, unspecified: Secondary | ICD-10-CM | POA: Insufficient documentation

## 2013-09-20 DIAGNOSIS — I10 Essential (primary) hypertension: Secondary | ICD-10-CM | POA: Diagnosis not present

## 2013-09-20 DIAGNOSIS — K769 Liver disease, unspecified: Secondary | ICD-10-CM | POA: Insufficient documentation

## 2013-09-20 LAB — CBC WITH DIFFERENTIAL/PLATELET
Basophils Absolute: 0.1 10*3/uL (ref 0.0–0.1)
Basophils Relative: 1 % (ref 0–1)
Eosinophils Absolute: 0.3 10*3/uL (ref 0.0–0.7)
Eosinophils Relative: 3 % (ref 0–5)
HCT: 39.7 % (ref 39.0–52.0)
Hemoglobin: 13.3 g/dL (ref 13.0–17.0)
Lymphocytes Relative: 8 % — ABNORMAL LOW (ref 12–46)
Lymphs Abs: 0.7 10*3/uL (ref 0.7–4.0)
MCH: 32.1 pg (ref 26.0–34.0)
MCHC: 33.5 g/dL (ref 30.0–36.0)
MCV: 95.9 fL (ref 78.0–100.0)
Monocytes Absolute: 0.8 10*3/uL (ref 0.1–1.0)
Monocytes Relative: 9 % (ref 3–12)
Neutro Abs: 6.6 10*3/uL (ref 1.7–7.7)
Neutrophils Relative %: 79 % — ABNORMAL HIGH (ref 43–77)
Platelets: 211 10*3/uL (ref 150–400)
RBC: 4.14 MIL/uL — ABNORMAL LOW (ref 4.22–5.81)
RDW: 15.4 % (ref 11.5–15.5)
WBC: 8.3 10*3/uL (ref 4.0–10.5)

## 2013-09-20 LAB — PROTIME-INR
INR: 0.99 (ref 0.00–1.49)
PROTHROMBIN TIME: 13.1 s (ref 11.6–15.2)

## 2013-09-20 LAB — BASIC METABOLIC PANEL
ANION GAP: 14 (ref 5–15)
BUN: 23 mg/dL (ref 6–23)
CALCIUM: 9.6 mg/dL (ref 8.4–10.5)
CO2: 24 mEq/L (ref 19–32)
Chloride: 101 mEq/L (ref 96–112)
Creatinine, Ser: 1.1 mg/dL (ref 0.50–1.35)
GFR calc Af Amer: 70 mL/min — ABNORMAL LOW (ref >90)
GFR, EST NON AFRICAN AMERICAN: 60 mL/min — AB (ref >90)
Glucose, Bld: 105 mg/dL — ABNORMAL HIGH (ref 70–99)
POTASSIUM: 4.5 meq/L (ref 3.7–5.3)
SODIUM: 139 meq/L (ref 137–147)

## 2013-09-20 LAB — APTT: APTT: 28 s (ref 24–37)

## 2013-09-20 LAB — GLUCOSE, CAPILLARY: GLUCOSE-CAPILLARY: 91 mg/dL (ref 70–99)

## 2013-09-20 MED ORDER — MIDAZOLAM HCL 2 MG/2ML IJ SOLN
INTRAMUSCULAR | Status: AC | PRN
  Administered 2013-09-20 (×2): 1 mg via INTRAVENOUS

## 2013-09-20 MED ORDER — FENTANYL CITRATE 0.05 MG/ML IJ SOLN
INTRAMUSCULAR | Status: AC
  Filled 2013-09-20: qty 6

## 2013-09-20 MED ORDER — FENTANYL CITRATE 0.05 MG/ML IJ SOLN
INTRAMUSCULAR | Status: AC | PRN
  Administered 2013-09-20 (×2): 50 ug via INTRAVENOUS

## 2013-09-20 MED ORDER — SODIUM CHLORIDE 0.9 % IV SOLN
INTRAVENOUS | Status: DC
  Administered 2013-09-20: 12:00:00 via INTRAVENOUS

## 2013-09-20 MED ORDER — MIDAZOLAM HCL 2 MG/2ML IJ SOLN
INTRAMUSCULAR | Status: AC
  Filled 2013-09-20: qty 6

## 2013-09-20 MED ORDER — HYDROCODONE-ACETAMINOPHEN 5-325 MG PO TABS: 1.0000 | ORAL_TABLET | ORAL | Status: DC | PRN

## 2013-09-20 NOTE — H&P (Signed)
Lonnie Barron is an 80 y.o. male.   Chief Complaint: "I'm having a liver biopsy" HPI: Patient with remote history of prostate cancer, weight loss, RUQ abdominal pain, elevated AFP and imaging findings suspicious for right lobe Madison presents today for US guided liver lesion biopsy.  Past Medical History  Diagnosis Date  . Hypertension   . PAC (premature atrial contraction)   . PVC's (premature ventricular contractions)   . Tobacco dependence   . Diabetes mellitus   . Hyperlipidemia   . Osteoarthritis   . Peripheral neuropathy   . Complication of anesthesia     ?, became limp after surgery -was hospitalized  . COPD (chronic obstructive pulmonary disease)   . Prostate cancer dx'd 2012    xrt  . Liver cancer dx'd 08/2013    w/ prob renal cell ca    Past Surgical History  Procedure Laterality Date  . Replacement total knee    . Total shoulder replacement    . Cholecystectomy    . Cardiovascular stress test  03/27/2007    EF 51%    Family History  Problem Relation Age of Onset  . COPD Mother   . Stroke Father    Social History:  reports that he has been smoking.  He has never used smokeless tobacco. He reports that he does not drink alcohol or use illicit drugs.  Allergies:  Allergies  Allergen Reactions  . Ciprofloxacin Hives, Shortness Of Breath and Itching  . Codeine Anxiety    Current outpatient prescriptions:clonazePAM (KLONOPIN) 0.5 MG tablet, Take 0.5 mg by mouth at bedtime., Disp: , Rfl: ;  DULoxetine (CYMBALTA) 60 MG capsule, Take 60 mg by mouth See admin instructions. Takes 1 every other day for a week. Then 1 every 3 day for a week then stop. Started weaning 09/09/13, Disp: , Rfl: ;  gabapentin (NEURONTIN) 600 MG tablet, Take 300 mg by mouth 4 (four) times daily., Disp: , Rfl:  metoprolol succinate (TOPROL-XL) 50 MG 24 hr tablet, Take 50 mg by mouth every morning. Take with or immediately following a meal., Disp: , Rfl: ;  Omega-3 Fatty Acids (FISH OIL) 1200 MG  CAPS, Take 1,200 mg by mouth 3 (three) times daily., Disp: , Rfl: ;  oxyCODONE (OXY IR/ROXICODONE) 5 MG immediate release tablet, Take 10 mg by mouth every 6 (six) hours as needed for severe pain., Disp: , Rfl:  OxyCODONE (OXYCONTIN) 15 mg T12A 12 hr tablet, Take 1 tablet (15 mg total) by mouth every 12 (twelve) hours., Disp: 60 tablet, Rfl: 0;  traMADol (ULTRAM) 50 MG tablet, Take 50-100 mg by mouth 2 (two) times daily. Takes 1 tablet in the morning and 2 before bed, Disp: , Rfl: ;  vitamin B-12 (CYANOCOBALAMIN) 1000 MCG tablet, Take 1,000 mcg by mouth daily., Disp: , Rfl:  VOLTAREN 1 % GEL, Apply 1 application topically daily as needed (Knee pain). To knees, Disp: , Rfl: ;  albuterol (PROVENTIL HFA;VENTOLIN HFA) 108 (90 BASE) MCG/ACT inhaler, Inhale 2 puffs into the lungs every 4 (four) hours as needed for wheezing or shortness of breath., Disp: 1 Inhaler, Rfl: 0 albuterol (PROVENTIL HFA;VENTOLIN HFA) 108 (90 BASE) MCG/ACT inhaler, Inhale 1 puff into the lungs every 6 (six) hours as needed for wheezing or shortness of breath., Disp: , Rfl: ;  nitroGLYCERIN (NITROSTAT) 0.4 MG SL tablet, Place 1 tablet (0.4 mg total) under the tongue every 5 (five) minutes as needed for chest pain., Disp: 25 tablet, Rfl: 2;  OVER THE COUNTER MEDICATION,  Take 3 tablets by mouth daily. Formula 303. Muscle relaxer., Disp: , Rfl:  tiotropium (SPIRIVA) 18 MCG inhalation capsule, Place 18 mcg into inhaler and inhale daily., Disp: , Rfl:  Current facility-administered medications:fentaNYL (SUBLIMAZE) 0.05 MG/ML injection, , , , ;  midazolam (VERSED) 2 MG/2ML injection, , , ,  Facility-Administered Medications Ordered in Other Encounters: 0.9 %  sodium chloride infusion, , Intravenous, Continuous, D Kevin Devyn Griffing, PA-C, Last Rate: 50 mL/hr at 09/20/13 1130  Results for orders placed in visit on 09/17/13  CBC WITH DIFFERENTIAL      Result Value Ref Range   WBC 8.0  4.0 - 10.3 10e3/uL   NEUT# 6.1  1.5 - 6.5 10e3/uL   HGB 13.6   13.0 - 17.1 g/dL   HCT 41.2  38.4 - 49.9 %   Platelets 271  140 - 400 10e3/uL   MCV 98.4 (*) 79.3 - 98.0 fL   MCH 32.4  27.2 - 33.4 pg   MCHC 32.9  32.0 - 36.0 g/dL   RBC 4.19 (*) 4.20 - 5.82 10e6/uL   RDW 15.2 (*) 11.0 - 14.6 %   lymph# 0.8 (*) 0.9 - 3.3 10e3/uL   MONO# 0.7  0.1 - 0.9 10e3/uL   Eosinophils Absolute 0.3  0.0 - 0.5 10e3/uL   Basophils Absolute 0.1  0.0 - 0.1 10e3/uL   NEUT% 76.6 (*) 39.0 - 75.0 %   LYMPH% 9.6 (*) 14.0 - 49.0 %   MONO% 8.5  0.0 - 14.0 %   EOS% 4.4  0.0 - 7.0 %   BASO% 0.9  0.0 - 2.0 %  COMPREHENSIVE METABOLIC PANEL (JY78)      Result Value Ref Range   Sodium 136  136 - 145 mEq/L   Potassium 4.6  3.5 - 5.1 mEq/L   Chloride 103  98 - 109 mEq/L   CO2 23  22 - 29 mEq/L   Glucose 105  70 - 140 mg/dl   BUN 24.8  7.0 - 26.0 mg/dL   Creatinine 1.1  0.7 - 1.3 mg/dL   Total Bilirubin 1.51 (*) 0.20 - 1.20 mg/dL   Alkaline Phosphatase 298 (*) 40 - 150 U/L   AST 439 (*) 5 - 34 U/L   ALT 52  0 - 55 U/L   Total Protein 6.6  6.4 - 8.3 g/dL   Albumin 2.7 (*) 3.5 - 5.0 g/dL   Calcium 9.7  8.4 - 10.4 mg/dL   Anion Gap 10  3 - 11 mEq/L  LACTATE DEHYDROGENASE (CC13)      Result Value Ref Range   LDH 483 (*) 125 - 245 U/L   09/20/13  PT 13.1  INR 0.99   HGB 13.3  PLTS 211K   Review of Systems  Constitutional: Positive for weight loss and malaise/fatigue. Negative for fever and chills.  Respiratory: Negative for hemoptysis.        Occ cough, dyspnea  Cardiovascular: Negative for chest pain.  Gastrointestinal: Positive for abdominal pain. Negative for vomiting and blood in stool.       Occ nausea  Genitourinary: Negative for hematuria.  Musculoskeletal: Positive for back pain.  Neurological: Negative for headaches.   Vitals: BP 149/69  HR 97  R 18  TEMP 98.4  O2 SATS 97% RA Physical Exam  Constitutional: He is oriented to person, place, and time. He appears well-developed and well-nourished.  Cardiovascular: Normal rate and regular rhythm.   Respiratory:  Effort normal and breath sounds normal.  GI: Soft.  Hepatomegaly;  tender RUQ  Musculoskeletal: Normal range of motion. He exhibits no edema.  Neurological: He is alert and oriented to person, place, and time.     Assessment/Plan Patient with remote history of prostate cancer, weight loss, RUQ abdominal pain, elevated AFP and imaging findings suspicious for right lobe HCC presents today for US guided liver lesion biopsy. Details/risks of procedure d/w pt/family with their understanding and consent.  Keymari Sato,D KEVIN 09/20/2013, 12:54 PM

## 2013-09-20 NOTE — Procedures (Signed)
US liver core biopsy 18g x3 to surg path No complication No blood loss. See complete dictation in [facility].

## 2013-09-20 NOTE — Discharge Instructions (Signed)
Liver Biopsy, Care After °Refer to this sheet in the next few weeks. These instructions provide you with information on caring for yourself after your procedure. Your health care provider may also give you more specific instructions. Your treatment has been planned according to current medical practices, but problems sometimes occur. Call your health care provider if you have any problems or questions after your procedure. °WHAT TO EXPECT AFTER THE PROCEDURE °After your procedure, it is typical to have the following: °· A small amount of discomfort in the area where the biopsy was done and in the right shoulder or shoulder blade. °· A small amount of bruising around the area where the biopsy was done and on the skin over the liver. °· Sleepiness and fatigue for the rest of the day. °HOME CARE INSTRUCTIONS  °· Rest at home for 1-2 days or as directed by your health care provider. °· Have a friend or family member stay with you for at least 24 hours. °· Because of the medicines used during the procedure, you should not do the following things in the first 24 hours: °¨ Drive. °¨ Use machinery. °¨ Be responsible for the care of other people. °¨ Sign legal documents. °¨ Take a bath or shower. °· There are many different ways to close and cover an incision, including stitches, skin glue, and adhesive strips. Follow your health care provider's instructions on: °¨ Incision care. °¨ Bandage (dressing) changes and removal. °¨ Incision closure removal. °· Do not drink alcohol in the first week. °· Do not lift more than 5 pounds or play contact sports for 2 weeks after this test. °· Take medicines only as directed by your health care provider. Do not take medicine containing aspirin or non-steroidal anti-inflammatory medicines such as ibuprofen for 1 week after this test. °· It is your responsibility to get your test results. °SEEK MEDICAL CARE IF:  °· You have increased bleeding from an incision that results in more than a  small spot of blood. °· You have redness, swelling, or increasing pain in any incisions. °· You notice a discharge or a bad smell coming from any of your incisions. °· You have a fever or chills. °SEEK IMMEDIATE MEDICAL CARE IF:  °· You develop swelling, bloating, or pain in your abdomen. °· You become dizzy or faint. °· You develop a rash. °· You are nauseous or vomit. °· You have difficulty breathing, feel short of breath, or feel faint. °· You develop chest pain. °· You have problems with your speech or vision. °· You have trouble balancing or moving your arms or legs. °Document Released: 08/31/2004 Document Revised: 06/28/2013 Document Reviewed: 04/09/2013 °ExitCare® Patient Information ©2015 [employer]. This information is not intended to replace advice given to you by your health care provider. Make sure you discuss any questions you have with your health care provider. °Conscious Sedation °Sedation is the use of medicines to promote relaxation and relieve discomfort and anxiety. Conscious sedation is a type of sedation. Under conscious sedation you are less alert than normal but are still able to respond to instructions or stimulation. Conscious sedation is used during short medical and dental procedures. It is milder than deep sedation or general anesthesia and allows you to return to your regular activities sooner.  °LET YOUR HEALTH CARE PROVIDER KNOW ABOUT:  °· Any allergies you have. °· All medicines you are taking, including vitamins, herbs, eye drops, creams, and over-the-counter medicines. °· Use of steroids (by mouth or creams). °·   Previous problems you or members of your family have had with the use of anesthetics. °· Any blood disorders you have. °· Previous surgeries you have had. °· Medical conditions you have. °· Possibility of pregnancy, if this applies. °· Use of cigarettes, alcohol, or illegal drugs. °RISKS AND COMPLICATIONS °Generally, this is a safe procedure. However, as with any  procedure, problems can occur. Possible problems include: °· Oversedation. °· Trouble breathing on your own. You may need to have a breathing tube until you are awake and breathing on your own. °· Allergic reaction to any of the medicines used for the procedure. °BEFORE THE PROCEDURE °· You may have blood tests done. These tests can help show how well your kidneys and liver are working. They can also show how well your blood clots. °· A physical exam will be done.   °· Only take medicines as directed by your health care provider. You may need to stop taking medicines (such as blood thinners, aspirin, or nonsteroidal anti-inflammatory drugs) before the procedure.   °· Do not eat or drink at least 6 hours before the procedure or as directed by your health care provider. °· Arrange for a responsible adult, family member, or friend to take you home after the procedure. He or she should stay with you for at least 24 hours after the procedure, until the medicine has worn off. °PROCEDURE  °· An intravenous (IV) catheter will be inserted into one of your veins. Medicine will be able to flow directly into your body through this catheter. You may be given medicine through this tube to help prevent pain and help you relax. °· The medical or dental procedure will be done. °AFTER THE PROCEDURE °· You will stay in a recovery area until the medicine has worn off. Your blood pressure and pulse will be checked.   °·  Depending on the procedure you had, you may be allowed to go home when you can tolerate liquids and your pain is under control. °Document Released: 11/06/2000 Document Revised: 02/16/2013 Document Reviewed: 10/19/2012 °ExitCare® Patient Information ©2015 [employer]. This information is not intended to replace advice given to you by your health care provider. Make sure you discuss any questions you have with your health care provider. ° °

## 2013-09-22 ENCOUNTER — Ambulatory Visit
Admission: RE | Admit: 2013-09-22 | Discharge: 2013-09-22 | Disposition: A | Discharge status: Home / Self Care | Payer: Medicare Other | Source: Ambulatory Visit | Attending: Internal Medicine | Admitting: Internal Medicine

## 2013-09-22 DIAGNOSIS — C220 Liver cell carcinoma: Secondary | ICD-10-CM

## 2013-09-23 ENCOUNTER — Other Ambulatory Visit (HOSPITAL_COMMUNITY): Payer: Self-pay | Admitting: Interventional Radiology

## 2013-09-23 ENCOUNTER — Encounter: Payer: Self-pay | Admitting: Family Medicine

## 2013-09-23 DIAGNOSIS — C228 Malignant neoplasm of liver, primary, unspecified as to type: Secondary | ICD-10-CM

## 2013-09-24 ENCOUNTER — Other Ambulatory Visit (HOSPITAL_COMMUNITY): Payer: Self-pay | Admitting: Interventional Radiology

## 2013-09-24 ENCOUNTER — Encounter (HOSPITAL_COMMUNITY): Payer: Self-pay | Admitting: Pharmacy Technician

## 2013-09-24 DIAGNOSIS — C228 Malignant neoplasm of liver, primary, unspecified as to type: Secondary | ICD-10-CM

## 2013-09-27 ENCOUNTER — Encounter (HOSPITAL_COMMUNITY): Payer: Self-pay

## 2013-09-27 ENCOUNTER — Ambulatory Visit (HOSPITAL_COMMUNITY)
Admission: RE | Admit: 2013-09-27 | Discharge: 2013-09-27 | Disposition: A | Discharge status: Home / Self Care | Payer: Medicare Other | Source: Ambulatory Visit | Attending: Interventional Radiology | Admitting: Interventional Radiology

## 2013-09-27 ENCOUNTER — Telehealth: Payer: Self-pay | Admitting: Medical Oncology

## 2013-09-27 DIAGNOSIS — C228 Malignant neoplasm of liver, primary, unspecified as to type: Secondary | ICD-10-CM | POA: Insufficient documentation

## 2013-09-27 MED ORDER — IOHEXOL 350 MG/ML SOLN
100.0000 mL | Freq: Once | INTRAVENOUS | Status: AC | PRN
  Administered 2013-09-27: 100 mL via INTRAVENOUS

## 2013-09-27 NOTE — Telephone Encounter (Signed)
Pt's wife called asking for results of the biopsy. She asked if pt will be able to get his pain medication when he goes for the procedure on Thurs.and if he will get to go home. He is currently taking oxycontin 15 mg BID.I spoke with IR and I was told pt will be sedated and if no complications he will go home. I explained this to the pt and I let her know she should get a call from [place] to go over any instructions. If she does not get a call to call [phone]. She voiced understanding.

## 2013-09-28 ENCOUNTER — Other Ambulatory Visit: Payer: Self-pay | Admitting: Radiology

## 2013-09-30 ENCOUNTER — Ambulatory Visit (HOSPITAL_COMMUNITY)
Admission: RE | Admit: 2013-09-30 | Discharge: 2013-09-30 | Disposition: A | Discharge status: Home / Self Care | Payer: Medicare Other | Source: Ambulatory Visit | Attending: Interventional Radiology | Admitting: Interventional Radiology

## 2013-09-30 ENCOUNTER — Encounter (HOSPITAL_COMMUNITY)
Admission: RE | Admit: 2013-09-30 | Discharge: 2013-09-30 | Disposition: A | Discharge status: Home / Self Care | Payer: Medicare Other | Source: Ambulatory Visit | Attending: Interventional Radiology | Admitting: Interventional Radiology

## 2013-09-30 ENCOUNTER — Encounter (HOSPITAL_COMMUNITY): Payer: Self-pay

## 2013-09-30 ENCOUNTER — Other Ambulatory Visit (HOSPITAL_COMMUNITY): Payer: Self-pay | Admitting: Interventional Radiology

## 2013-09-30 DIAGNOSIS — E1149 Type 2 diabetes mellitus with other diabetic neurological complication: Secondary | ICD-10-CM | POA: Insufficient documentation

## 2013-09-30 DIAGNOSIS — Z79899 Other long term (current) drug therapy: Secondary | ICD-10-CM | POA: Diagnosis not present

## 2013-09-30 DIAGNOSIS — J449 Chronic obstructive pulmonary disease, unspecified: Secondary | ICD-10-CM | POA: Diagnosis not present

## 2013-09-30 DIAGNOSIS — Z96659 Presence of unspecified artificial knee joint: Secondary | ICD-10-CM | POA: Diagnosis not present

## 2013-09-30 DIAGNOSIS — C228 Malignant neoplasm of liver, primary, unspecified as to type: Secondary | ICD-10-CM | POA: Insufficient documentation

## 2013-09-30 DIAGNOSIS — I10 Essential (primary) hypertension: Secondary | ICD-10-CM | POA: Diagnosis not present

## 2013-09-30 DIAGNOSIS — Z96619 Presence of unspecified artificial shoulder joint: Secondary | ICD-10-CM | POA: Insufficient documentation

## 2013-09-30 DIAGNOSIS — E1142 Type 2 diabetes mellitus with diabetic polyneuropathy: Secondary | ICD-10-CM | POA: Insufficient documentation

## 2013-09-30 DIAGNOSIS — F17200 Nicotine dependence, unspecified, uncomplicated: Secondary | ICD-10-CM | POA: Insufficient documentation

## 2013-09-30 DIAGNOSIS — C61 Malignant neoplasm of prostate: Secondary | ICD-10-CM | POA: Insufficient documentation

## 2013-09-30 DIAGNOSIS — J4489 Other specified chronic obstructive pulmonary disease: Secondary | ICD-10-CM | POA: Insufficient documentation

## 2013-09-30 DIAGNOSIS — E785 Hyperlipidemia, unspecified: Secondary | ICD-10-CM | POA: Diagnosis not present

## 2013-09-30 LAB — COMPREHENSIVE METABOLIC PANEL
ALBUMIN: 2.6 g/dL — AB (ref 3.5–5.2)
ALT: 82 U/L — ABNORMAL HIGH (ref 0–53)
AST: 881 U/L — AB (ref 0–37)
Alkaline Phosphatase: 303 U/L — ABNORMAL HIGH (ref 39–117)
Anion gap: 14 (ref 5–15)
BILIRUBIN TOTAL: 1.1 mg/dL (ref 0.3–1.2)
BUN: 20 mg/dL (ref 6–23)
CALCIUM: 9.9 mg/dL (ref 8.4–10.5)
CHLORIDE: 100 meq/L (ref 96–112)
CO2: 23 mEq/L (ref 19–32)
CREATININE: 1.21 mg/dL (ref 0.50–1.35)
GFR calc Af Amer: 62 mL/min — ABNORMAL LOW (ref >90)
GFR calc non Af Amer: 54 mL/min — ABNORMAL LOW (ref >90)
Glucose, Bld: 92 mg/dL (ref 70–99)
Potassium: 4.5 mEq/L (ref 3.7–5.3)
Sodium: 137 mEq/L (ref 137–147)
Total Protein: 6.5 g/dL (ref 6.0–8.3)

## 2013-09-30 LAB — CBC WITH DIFFERENTIAL/PLATELET
Basophils Absolute: 0.1 10*3/uL (ref 0.0–0.1)
Basophils Relative: 1 % (ref 0–1)
Eosinophils Absolute: 0.3 10*3/uL (ref 0.0–0.7)
Eosinophils Relative: 4 % (ref 0–5)
HEMATOCRIT: 41.9 % (ref 39.0–52.0)
Hemoglobin: 14.2 g/dL (ref 13.0–17.0)
Lymphocytes Relative: 12 % (ref 12–46)
Lymphs Abs: 0.8 10*3/uL (ref 0.7–4.0)
MCH: 33.1 pg (ref 26.0–34.0)
MCHC: 33.9 g/dL (ref 30.0–36.0)
MCV: 97.7 fL (ref 78.0–100.0)
MONO ABS: 0.7 10*3/uL (ref 0.1–1.0)
Monocytes Relative: 11 % (ref 3–12)
Neutro Abs: 5 10*3/uL (ref 1.7–7.7)
Neutrophils Relative %: 72 % (ref 43–77)
Platelets: 197 10*3/uL (ref 150–400)
RBC: 4.29 MIL/uL (ref 4.22–5.81)
RDW: 15.9 % — ABNORMAL HIGH (ref 11.5–15.5)
WBC: 7 10*3/uL (ref 4.0–10.5)

## 2013-09-30 LAB — PROTIME-INR
INR: 1.02 (ref 0.00–1.49)
PROTHROMBIN TIME: 13.4 s (ref 11.6–15.2)

## 2013-09-30 LAB — GLUCOSE, CAPILLARY: Glucose-Capillary: 90 mg/dL (ref 70–99)

## 2013-09-30 LAB — APTT: aPTT: 28 seconds (ref 24–37)

## 2013-09-30 MED ORDER — MIDAZOLAM HCL 2 MG/2ML IJ SOLN
INTRAMUSCULAR | Status: AC
  Filled 2013-09-30: qty 6

## 2013-09-30 MED ORDER — IOHEXOL 300 MG/ML  SOLN
80.0000 mL | Freq: Once | INTRAMUSCULAR | Status: AC | PRN
  Administered 2013-09-30: 1 mL via INTRA_ARTERIAL

## 2013-09-30 MED ORDER — MIDAZOLAM HCL 2 MG/2ML IJ SOLN
INTRAMUSCULAR | Status: AC | PRN
  Administered 2013-09-30: 0.5 mg via INTRAVENOUS
  Administered 2013-09-30: 1 mg via INTRAVENOUS
  Administered 2013-09-30 (×3): 0.5 mg via INTRAVENOUS
  Administered 2013-09-30: 1 mg via INTRAVENOUS
  Administered 2013-09-30 (×4): 0.5 mg via INTRAVENOUS

## 2013-09-30 MED ORDER — LIDOCAINE HCL 1 % IJ SOLN
INTRAMUSCULAR | Status: AC
  Filled 2013-09-30: qty 20

## 2013-09-30 MED ORDER — FENTANYL CITRATE 0.05 MG/ML IJ SOLN
INTRAMUSCULAR | Status: AC
  Filled 2013-09-30: qty 6

## 2013-09-30 MED ORDER — OXYCODONE HCL 5 MG PO TABS
5.0000 mg | ORAL_TABLET | Freq: Four times a day (QID) | ORAL | Status: DC | PRN
  Administered 2013-09-30: 5 mg via ORAL
  Filled 2013-09-30: qty 1

## 2013-09-30 MED ORDER — SODIUM CHLORIDE 0.9 % IV SOLN
INTRAVENOUS | Status: DC
  Administered 2013-09-30: 08:00:00 via INTRAVENOUS

## 2013-09-30 MED ORDER — TECHNETIUM TO 99M ALBUMIN AGGREGATED
5.2000 | Freq: Once | INTRAVENOUS | Status: AC | PRN
  Administered 2013-09-30: 5 via INTRAVENOUS

## 2013-09-30 MED ORDER — FENTANYL CITRATE 0.05 MG/ML IJ SOLN
INTRAMUSCULAR | Status: AC | PRN
  Administered 2013-09-30 (×3): 25 ug via INTRAVENOUS
  Administered 2013-09-30: 50 ug via INTRAVENOUS
  Administered 2013-09-30 (×3): 25 ug via INTRAVENOUS

## 2013-09-30 NOTE — Procedures (Addendum)
Celiac/SMA angio GDA embo Coils - 4 x 15 times, 5 x 8 times 4 No comp

## 2013-09-30 NOTE — Discharge Instructions (Signed)
Arteriogram Care After These instructions give you information on caring for yourself after your procedure. Your doctor may also give you more specific instructions. Call your doctor if you have any problems or questions after your procedure. HOME CARE  Keep your leg straight for at least 6 hours.  Do not bathe, swim, or use a hot tub until directed by your doctor. You can shower.  Do not lift anything heavier than 10 pounds (about a gallon of milk) for 2 days.  Do not walk a lot, run, or drive for 2 days.  Return to normal activities in 2 days or as told by your doctor. Finding out the results of your test Ask when your test results will be ready. Make sure you get your test results. GET HELP RIGHT AWAY IF:   You have fever.  You have more pain in your leg.  The leg that was cut is:  Bleeding.  Puffy (swollen) or red.  Cold.  Pale or changes color.  Weak.  Tingly or numb. If you go to the Emergency Room, tell your nurse that you have had an arteriogram. Take this paper with you to show the nurse. MAKE SURE YOU:  Understand these instructions.  Will watch your condition.  Will get help right away if you are not doing well or get worse. Document Released: 05/10/2008 Document Revised: 02/16/2013 Document Reviewed: 05/10/2008 [facility] Patient Information 2015 [place], [place]. This information is not intended to replace advice given to you by your health care provider. Make sure you discuss any questions you have with your health care provider. Conscious Sedation Sedation is the use of medicines to promote relaxation and relieve discomfort and anxiety. Conscious sedation is a type of sedation. Under conscious sedation you are less alert than normal but are still able to respond to instructions or stimulation. Conscious sedation is used during short medical and dental procedures. It is milder than deep sedation or general anesthesia and allows you to return to your regular  activities sooner.  LET [facility] CARE PROVIDER KNOW ABOUT:   Any allergies you have.  All medicines you are taking, including vitamins, herbs, eye drops, creams, and over-the-counter medicines.  Use of steroids (by mouth or creams).  Previous problems you or members of your family have had with the use of anesthetics.  Any blood disorders you have.  Previous surgeries you have had.  Medical conditions you have.  Possibility of pregnancy, if this applies.  Use of cigarettes, alcohol, or illegal drugs. RISKS AND COMPLICATIONS Generally, this is a safe procedure. However, as with any procedure, problems can occur. Possible problems include:  Oversedation.  Trouble breathing on your own. You may need to have a breathing tube until you are awake and breathing on your own.  Allergic reaction to any of the medicines used for the procedure. BEFORE THE PROCEDURE  You may have blood tests done. These tests can help show how well your kidneys and liver are working. They can also show how well your blood clots.  A physical exam will be done.  Only take medicines as directed by your health care provider. You may need to stop taking medicines (such as blood thinners, aspirin, or nonsteroidal anti-inflammatory drugs) before the procedure.   Do not eat or drink at least 6 hours before the procedure or as directed by your health care provider.  Arrange for a responsible adult, family member, or friend to take you home after the procedure. He or she should stay  with you for at least 24 hours after the procedure, until the medicine has worn off. PROCEDURE   An intravenous (IV) catheter will be inserted into one of your veins. Medicine will be able to flow directly into your body through this catheter. You may be given medicine through this tube to help prevent pain and help you relax.  The medical or dental procedure will be done. AFTER THE PROCEDURE  You will stay in a recovery area  until the medicine has worn off. Your blood pressure and pulse will be checked.   Depending on the procedure you had, you may be allowed to go home when you can tolerate liquids and your pain is under control. Document Released: 11/06/2000 Document Revised: 02/16/2013 Document Reviewed: 10/19/2012 [facility] Patient Information 2015 [place], [place]. This information is not intended to replace advice given to you by your health care provider. Make sure you discuss any questions you have with your health care provider.

## 2013-09-30 NOTE — Sedation Documentation (Signed)
Pt transported to Short Stay for recovery. 

## 2013-09-30 NOTE — Sedation Documentation (Signed)
5Fr Sheath removed from R Femoral Artery by Lambert Mody, RTR.  Hemostasis achieved using manual pressure and V pad.  Groin level 0, 2+RDP.

## 2013-09-30 NOTE — H&P (Signed)
Lonnie Barron is an 80 y.o. male.   Chief Complaint: "I'm having a treatment on my liver" HPI: Patient with history of unresectable right lobe [place] presents today for pre Y 90 mapping arteriography/embolization/test dosing.  Past Medical History  Diagnosis Date  . Hypertension   . PAC (premature atrial contraction)   . PVC's (premature ventricular contractions)   . Tobacco dependence   . Hyperlipidemia   . Osteoarthritis   . Peripheral neuropathy   . Complication of anesthesia     ?, became limp after surgery -was hospitalized  . COPD (chronic obstructive pulmonary disease)   . Prostate cancer dx'd 2012    xrt  . Liver cancer dx'd 08/2013    w/ prob renal cell ca  . Diabetes mellitus     Past Surgical History  Procedure Laterality Date  . Replacement total knee    . Total shoulder replacement    . Cholecystectomy    . Cardiovascular stress test  03/27/2007    EF 51%    Family History  Problem Relation Age of Onset  . COPD Mother   . Stroke Father    Social History:  reports that he has been smoking.  He has never used smokeless tobacco. He reports that he does not drink alcohol or use illicit drugs.  Allergies:  Allergies  Allergen Reactions  . Ciprofloxacin Hives, Shortness Of Breath and Itching  . Codeine Anxiety    Current outpatient prescriptions:DULoxetine (CYMBALTA) 60 MG capsule, Take 60 mg by mouth See admin instructions. Takes 1 every other day for a week. Then 1 every 3 day for a week then stop. Started weaning 09/09/13, Disp: , Rfl: ;  gabapentin (NEURONTIN) 600 MG tablet, Take 300 mg by mouth 4 (four) times daily., Disp: , Rfl:  metoprolol succinate (TOPROL-XL) 50 MG 24 hr tablet, Take 50 mg by mouth every morning. Take with or immediately following a meal., Disp: , Rfl: ;  Omega-3 Fatty Acids (FISH OIL) 1200 MG CAPS, Take 1,200 mg by mouth 3 (three) times daily., Disp: , Rfl: ;  oxyCODONE (OXY IR/ROXICODONE) 5 MG immediate release tablet, Take 10 mg by  mouth every 6 (six) hours as needed for severe pain., Disp: , Rfl:  OxyCODONE (OXYCONTIN) 15 mg T12A 12 hr tablet, Take 1 tablet (15 mg total) by mouth every 12 (twelve) hours., Disp: 60 tablet, Rfl: 0;  tiotropium (SPIRIVA) 18 MCG inhalation capsule, Place 18 mcg into inhaler and inhale daily., Disp: , Rfl: ;  traMADol (ULTRAM) 50 MG tablet, Take 50-100 mg by mouth 2 (two) times daily. Takes 1 tablet in the morning and 2 before bed, Disp: , Rfl:  vitamin B-12 (CYANOCOBALAMIN) 1000 MCG tablet, Take 1,000 mcg by mouth daily., Disp: , Rfl: ;  VOLTAREN 1 % GEL, Apply 1 application topically daily as needed (Knee pain). To knees, Disp: , Rfl: ;  albuterol (PROVENTIL HFA;VENTOLIN HFA) 108 (90 BASE) MCG/ACT inhaler, Inhale 2 puffs into the lungs every 4 (four) hours as needed for wheezing or shortness of breath., Disp: 1 Inhaler, Rfl: 0 clonazePAM (KLONOPIN) 0.5 MG tablet, Take 0.5 mg by mouth at bedtime., Disp: , Rfl: ;  nitroGLYCERIN (NITROSTAT) 0.4 MG SL tablet, Place 1 tablet (0.4 mg total) under the tongue every 5 (five) minutes as needed for chest pain., Disp: 25 tablet, Rfl: 2 Current facility-administered medications:0.9 %  sodium chloride infusion, , Intravenous, Continuous, D Kevin Alieyah Spader, PA-C, Last Rate: 75 mL/hr at 09/30/13 0807   Results for orders placed during  the hospital encounter of 09/30/13 (from the past 48 hour(s))  APTT     Status: None   Collection Time    09/30/13  8:00 AM      Result Value Ref Range   aPTT 28  24 - 37 seconds  CBC WITH DIFFERENTIAL     Status: Abnormal   Collection Time    09/30/13  8:00 AM      Result Value Ref Range   WBC 7.0  4.0 - 10.5 K/uL   RBC 4.29  4.22 - 5.81 MIL/uL   Hemoglobin 14.2  13.0 - 17.0 g/dL   HCT 41.9  39.0 - 52.0 %   MCV 97.7  78.0 - 100.0 fL   MCH 33.1  26.0 - 34.0 pg   MCHC 33.9  30.0 - 36.0 g/dL   RDW 15.9 (*) 11.5 - 15.5 %   Platelets 197  150 - 400 K/uL   Neutrophils Relative % 72  43 - 77 %   Neutro Abs 5.0  1.7 - 7.7 K/uL    Lymphocytes Relative 12  12 - 46 %   Lymphs Abs 0.8  0.7 - 4.0 K/uL   Monocytes Relative 11  3 - 12 %   Monocytes Absolute 0.7  0.1 - 1.0 K/uL   Eosinophils Relative 4  0 - 5 %   Eosinophils Absolute 0.3  0.0 - 0.7 K/uL   Basophils Relative 1  0 - 1 %   Basophils Absolute 0.1  0.0 - 0.1 K/uL  COMPREHENSIVE METABOLIC PANEL     Status: Abnormal   Collection Time    09/30/13  8:00 AM      Result Value Ref Range   Sodium 137  137 - 147 mEq/L   Potassium 4.5  3.7 - 5.3 mEq/L   Chloride 100  96 - 112 mEq/L   CO2 23  19 - 32 mEq/L   Glucose, Bld 92  70 - 99 mg/dL   BUN 20  6 - 23 mg/dL   Creatinine, Ser 1.21  0.50 - 1.35 mg/dL   Calcium 9.9  8.4 - 10.5 mg/dL   Total Protein 6.5  6.0 - 8.3 g/dL   Albumin 2.6 (*) 3.5 - 5.2 g/dL   AST 881 (*) 0 - 37 U/L   ALT 82 (*) 0 - 53 U/L   Alkaline Phosphatase 303 (*) 39 - 117 U/L   Total Bilirubin 1.1  0.3 - 1.2 mg/dL   GFR calc non Af Amer 54 (*) >90 mL/min   GFR calc Af Amer 62 (*) >90 mL/min   Comment: (NOTE)     The eGFR has been calculated using the CKD EPI equation.     This calculation has not been validated in all clinical situations.     eGFR's persistently <90 mL/min signify possible Chronic Kidney     Disease.   Anion gap 14  5 - 15  PROTIME-INR     Status: None   Collection Time    09/30/13  8:00 AM      Result Value Ref Range   Prothrombin Time 13.4  11.6 - 15.2 seconds   INR 1.02  0.00 - 1.49   No results found.  Review of Systems  Constitutional: Positive for malaise/fatigue. Negative for fever and chills.  Respiratory: Negative for hemoptysis.        Occ cough, dyspnea with exertion  Cardiovascular: Negative for chest pain.  Gastrointestinal: Positive for abdominal pain. Negative for vomiting and blood in  stool.       Occ nausea  Genitourinary: Negative for dysuria and hematuria.  Musculoskeletal: Positive for back pain.  Neurological: Negative for headaches.    Blood pressure 127/80, pulse 66, temperature 98.3 F  (36.8 C), temperature source Oral, resp. rate 18, height _0  (1.88 m), weight 191 lb (86.637 kg), SpO2 95.00%. Physical Exam  Constitutional: He is oriented to person, place, and time. He appears well-developed and well-nourished.  Cardiovascular: Normal rate.   occ ectopy noted  Respiratory: Effort normal and breath sounds normal.  GI: Soft. Bowel sounds are normal. There is tenderness.  Musculoskeletal: Normal range of motion. He exhibits no edema.  Neurological: He is oriented to person, place, and time.     Assessment/Plan  Patient with history of unresectable right lobe HCC presents today for pre Y 90 mapping arteriography/embolization/test dosing. Details/risks of procedure d/w pt/family with their understanding and consent.  Sylver Vantassell,D KEVIN 09/30/2013, 9:12 AM

## 2013-10-01 ENCOUNTER — Telehealth: Payer: Self-pay | Admitting: Dietician

## 2013-10-01 ENCOUNTER — Telehealth: Payer: Self-pay | Admitting: *Deleted

## 2013-10-01 ENCOUNTER — Telehealth: Payer: Self-pay | Admitting: Family Medicine

## 2013-10-01 NOTE — Telephone Encounter (Signed)
Received call from Janyth Pupa, Chaplain for [employer].   Requested more information for referral nurse.   Information given and nurse to return call.

## 2013-10-01 NOTE — Telephone Encounter (Signed)
Call back number is [phone]  PT wife has called stating they were just told that treatments will not work and it would be good idea for Hospice to come in and she is wanting to speak to you about this. I wasn't sure if you would want to call and talk to her or for them to come in.

## 2013-10-01 NOTE — Telephone Encounter (Signed)
Dr. Dennard Schaumann stated wants someone from hospice to come out to pts home ASAP(today) for consultation, I contacted [facility] and the chaplain answered the phone and he will let the front office person know when she comes in to give [place] a call so that someone can try to go out to pt home.

## 2013-10-01 NOTE — Telephone Encounter (Signed)
Contacted hospice [facility] and they will be calling me back in reference to someone going to pts home for consultation

## 2013-10-01 NOTE — Telephone Encounter (Signed)
I spoke with the patient and she requested hospice.  I will gladly consult asap.

## 2013-10-01 NOTE — Telephone Encounter (Signed)
Brief Outpatient Oncology Nutrition Note  Patient has been identified to be at risk on malnutrition screen.  Wt Readings from Last 10 Encounters:  09/30/13 191 lb (86.637 kg)  09/22/13 191 lb (86.637 kg)  09/20/13 190 lb (86.183 kg)  09/17/13 192 lb 3.2 oz (87.181 kg)  09/09/13 197 lb (89.359 kg)  09/06/13 194 lb 14.4 oz (88.406 kg)  08/20/13 198 lb (89.812 kg)  07/26/13 198 lb (89.812 kg)  05/06/13 213 lb (96.616 kg)  03/01/13 214 lb (97.07 kg)    Dx:  HCC (hepatocellular carcinoma.  Patient of Dr. Juliann Mule. Newly diagnosed 09/06/13.  Called patient due to weight loss and spoke with wife.  Wife reports that treatment is not working and they will be seeing [facility] and possibly hospice.  Patient with taste alterations, decreased appetite and appetite.  Drinks Boost.  Encouragement provided.  Referral made with Education officer, museum at [employer]  Will mail coupons for boost and tips to increase calories and protein as well as taste alterations along with contact information for the [facility].  Antonieta Iba, RD, LDN

## 2013-10-01 NOTE — Telephone Encounter (Signed)
I will call wife, but go ahead and consult hospice.

## 2013-10-01 NOTE — Telephone Encounter (Signed)
Receptionist contacted by Hospice and last visit noted were requested.   Notes faxed.

## 2013-10-04 ENCOUNTER — Other Ambulatory Visit: Payer: Self-pay | Admitting: Family Medicine

## 2013-10-05 ENCOUNTER — Telehealth: Payer: Self-pay | Admitting: Internal Medicine

## 2013-10-05 ENCOUNTER — Telehealth: Payer: Self-pay | Admitting: Family Medicine

## 2013-10-05 NOTE — Telephone Encounter (Signed)
ok 

## 2013-10-05 NOTE — Telephone Encounter (Signed)
Patient wife calling to get refill on on oxycodone if possible  [phone] when ready

## 2013-10-05 NOTE — Telephone Encounter (Signed)
Ok to refill 

## 2013-10-05 NOTE — Telephone Encounter (Signed)
Lft msg for pt advising pt of apt will mail apt to pt.Marland Kitchen..KJ

## 2013-10-05 NOTE — Telephone Encounter (Signed)
Prescription sent to pharmacy.

## 2013-10-07 ENCOUNTER — Telehealth: Payer: Self-pay

## 2013-10-07 ENCOUNTER — Telehealth: Payer: Self-pay | Admitting: Internal Medicine

## 2013-10-07 DIAGNOSIS — C220 Liver cell carcinoma: Secondary | ICD-10-CM

## 2013-10-07 MED ORDER — OXYCODONE HCL 5 MG PO TABS: 10.0000 mg | ORAL_TABLET | Freq: Four times a day (QID) | ORAL | Status: AC | PRN

## 2013-10-07 NOTE — Telephone Encounter (Signed)
Per 08/13 POF, pt aware of apt .Marland Kitchen..kj

## 2013-10-07 NOTE — Telephone Encounter (Signed)
RX printed, provider signature and pt aware to pick up

## 2013-10-07 NOTE — Telephone Encounter (Signed)
Returning wife's call. Pt is having increased swelling in his abd. It is tight and hurts some. No constipation, no urinary difficulties.  He is getting ankle and foot swelling. No SOB, sats running 95%. No fever. He has a little trouble swallowing and eats slowly. He uses a walker and he leans a little to the left. His ears still have a hollow tunnel feeling in them. She is wanting to know what stage the cancer is, what is the next step since Y90 is not an option. S/w Dr Juliann Mule and called her back. For the abd swelling if it gets worse may go to ER for possible paracentesis or symptom management. appt will be made for 2 pm tomorrow to see MD. Hospice consult will be initiated.

## 2013-10-08 ENCOUNTER — Emergency Department (HOSPITAL_COMMUNITY)
Admission: EM | Admit: 2013-10-08 | Discharge: 2013-10-08 | Disposition: A | Discharge status: Home / Self Care | Attending: Emergency Medicine | Admitting: Emergency Medicine

## 2013-10-08 ENCOUNTER — Ambulatory Visit (HOSPITAL_BASED_OUTPATIENT_CLINIC_OR_DEPARTMENT_OTHER): Payer: Medicare Other | Admitting: Internal Medicine

## 2013-10-08 ENCOUNTER — Encounter (HOSPITAL_COMMUNITY): Payer: Self-pay | Admitting: Emergency Medicine

## 2013-10-08 ENCOUNTER — Emergency Department (HOSPITAL_COMMUNITY)

## 2013-10-08 VITALS — BP 131/77 | HR 73 | Temp 97.6°F | Resp 18 | Ht 74.0 in | Wt 196.4 lb

## 2013-10-08 DIAGNOSIS — J4489 Other specified chronic obstructive pulmonary disease: Secondary | ICD-10-CM | POA: Insufficient documentation

## 2013-10-08 DIAGNOSIS — Z8505 Personal history of malignant neoplasm of liver: Secondary | ICD-10-CM | POA: Insufficient documentation

## 2013-10-08 DIAGNOSIS — I491 Atrial premature depolarization: Secondary | ICD-10-CM | POA: Diagnosis not present

## 2013-10-08 DIAGNOSIS — Z886 Allergy status to analgesic agent status: Secondary | ICD-10-CM | POA: Insufficient documentation

## 2013-10-08 DIAGNOSIS — R109 Unspecified abdominal pain: Secondary | ICD-10-CM

## 2013-10-08 DIAGNOSIS — G608 Other hereditary and idiopathic neuropathies: Secondary | ICD-10-CM | POA: Diagnosis not present

## 2013-10-08 DIAGNOSIS — I10 Essential (primary) hypertension: Secondary | ICD-10-CM | POA: Diagnosis not present

## 2013-10-08 DIAGNOSIS — C228 Malignant neoplasm of liver, primary, unspecified as to type: Secondary | ICD-10-CM | POA: Insufficient documentation

## 2013-10-08 DIAGNOSIS — R5381 Other malaise: Secondary | ICD-10-CM

## 2013-10-08 DIAGNOSIS — Z79899 Other long term (current) drug therapy: Secondary | ICD-10-CM | POA: Diagnosis not present

## 2013-10-08 DIAGNOSIS — F17200 Nicotine dependence, unspecified, uncomplicated: Secondary | ICD-10-CM

## 2013-10-08 DIAGNOSIS — J449 Chronic obstructive pulmonary disease, unspecified: Secondary | ICD-10-CM | POA: Insufficient documentation

## 2013-10-08 DIAGNOSIS — Z8546 Personal history of malignant neoplasm of prostate: Secondary | ICD-10-CM | POA: Insufficient documentation

## 2013-10-08 DIAGNOSIS — M1990 Unspecified osteoarthritis, unspecified site: Secondary | ICD-10-CM | POA: Insufficient documentation

## 2013-10-08 DIAGNOSIS — C220 Liver cell carcinoma: Secondary | ICD-10-CM

## 2013-10-08 DIAGNOSIS — I81 Portal vein thrombosis: Secondary | ICD-10-CM

## 2013-10-08 DIAGNOSIS — R1900 Intra-abdominal and pelvic swelling, mass and lump, unspecified site: Secondary | ICD-10-CM | POA: Diagnosis present

## 2013-10-08 DIAGNOSIS — R5383 Other fatigue: Secondary | ICD-10-CM

## 2013-10-08 DIAGNOSIS — I4949 Other premature depolarization: Secondary | ICD-10-CM | POA: Insufficient documentation

## 2013-10-08 DIAGNOSIS — N289 Disorder of kidney and ureter, unspecified: Secondary | ICD-10-CM

## 2013-10-08 DIAGNOSIS — E119 Type 2 diabetes mellitus without complications: Secondary | ICD-10-CM | POA: Insufficient documentation

## 2013-10-08 DIAGNOSIS — R531 Weakness: Secondary | ICD-10-CM

## 2013-10-08 LAB — COMPREHENSIVE METABOLIC PANEL
ALBUMIN: 2.4 g/dL — AB (ref 3.5–5.2)
ALT: 108 U/L — ABNORMAL HIGH (ref 0–53)
ANION GAP: 12 (ref 5–15)
AST: 1046 U/L — ABNORMAL HIGH (ref 0–37)
Alkaline Phosphatase: 309 U/L — ABNORMAL HIGH (ref 39–117)
BUN: 21 mg/dL (ref 6–23)
CALCIUM: 9.7 mg/dL (ref 8.4–10.5)
CO2: 26 mEq/L (ref 19–32)
CREATININE: 1.21 mg/dL (ref 0.50–1.35)
Chloride: 96 mEq/L (ref 96–112)
GFR calc Af Amer: 62 mL/min — ABNORMAL LOW (ref >90)
GFR calc non Af Amer: 54 mL/min — ABNORMAL LOW (ref >90)
Glucose, Bld: 104 mg/dL — ABNORMAL HIGH (ref 70–99)
Potassium: 4.9 mEq/L (ref 3.7–5.3)
Sodium: 134 mEq/L — ABNORMAL LOW (ref 137–147)
Total Bilirubin: 1.1 mg/dL (ref 0.3–1.2)
Total Protein: 6.5 g/dL (ref 6.0–8.3)

## 2013-10-08 LAB — CBC WITH DIFFERENTIAL/PLATELET
BASOS PCT: 1 % (ref 0–1)
Basophils Absolute: 0 10*3/uL (ref 0.0–0.1)
EOS ABS: 0.1 10*3/uL (ref 0.0–0.7)
EOS PCT: 2 % (ref 0–5)
HEMATOCRIT: 43.3 % (ref 39.0–52.0)
HEMOGLOBIN: 14.5 g/dL (ref 13.0–17.0)
Lymphocytes Relative: 11 % — ABNORMAL LOW (ref 12–46)
Lymphs Abs: 0.6 10*3/uL — ABNORMAL LOW (ref 0.7–4.0)
MCH: 32.4 pg (ref 26.0–34.0)
MCHC: 33.5 g/dL (ref 30.0–36.0)
MCV: 96.7 fL (ref 78.0–100.0)
MONO ABS: 0.7 10*3/uL (ref 0.1–1.0)
Monocytes Relative: 12 % (ref 3–12)
NEUTROS PCT: 74 % (ref 43–77)
Neutro Abs: 4.5 10*3/uL (ref 1.7–7.7)
Platelets: 197 10*3/uL (ref 150–400)
RBC: 4.48 MIL/uL (ref 4.22–5.81)
RDW: 16.1 % — ABNORMAL HIGH (ref 11.5–15.5)
WBC: 6 10*3/uL (ref 4.0–10.5)

## 2013-10-08 LAB — URINALYSIS, ROUTINE W REFLEX MICROSCOPIC
Bilirubin Urine: NEGATIVE
Glucose, UA: NEGATIVE mg/dL
Hgb urine dipstick: NEGATIVE
Ketones, ur: NEGATIVE mg/dL
Leukocytes, UA: NEGATIVE
NITRITE: NEGATIVE
Protein, ur: 30 mg/dL — AB
SPECIFIC GRAVITY, URINE: 1.022 (ref 1.005–1.030)
UROBILINOGEN UA: 0.2 mg/dL (ref 0.0–1.0)
pH: 5 (ref 5.0–8.0)

## 2013-10-08 LAB — URINE MICROSCOPIC-ADD ON: URINE-OTHER: NONE SEEN

## 2013-10-08 LAB — LIPASE, BLOOD: LIPASE: 15 U/L (ref 11–59)

## 2013-10-08 MED ORDER — MORPHINE SULFATE 4 MG/ML IJ SOLN
4.0000 mg | INTRAMUSCULAR | Status: DC | PRN
  Administered 2013-10-08: 4 mg via INTRAVENOUS
  Filled 2013-10-08: qty 1

## 2013-10-08 MED ORDER — IOHEXOL 300 MG/ML  SOLN
100.0000 mL | Freq: Once | INTRAMUSCULAR | Status: AC | PRN
  Administered 2013-10-08: 100 mL via INTRAVENOUS

## 2013-10-08 MED ORDER — ONDANSETRON HCL 4 MG/2ML IJ SOLN
4.0000 mg | Freq: Once | INTRAMUSCULAR | Status: AC | PRN
  Administered 2013-10-08: 4 mg via INTRAVENOUS
  Filled 2013-10-08: qty 2

## 2013-10-08 NOTE — ED Notes (Addendum)
Pt from cancer ctr with liver cancer for possible ascites and doctor was wanting pt to have a CT or ultrasound to determine if it is fluid and that i can be taken off. Pt reports abdominal 7/10. Pt reports "I just received bad news from my cancer doctor". Pt alert and oriented and NAD.

## 2013-10-08 NOTE — Discharge Instructions (Signed)
Liver Cancer °Cells divide to form new cells when the body needs them. That happens in the liver, just as it does in other organs. Sometimes, the cells divide too rapidly. Old cells do not die off, and the process gets out of control. A growth (tumor) forms. That is how liver cancer starts. °The liver is an important organ of the body. It is located on the upper right side of the belly (abdomen), just below the ribs. It is the largest organ in the body. In an adult man, it is about the size of a football. The liver stores sugar and iron. It also cleans (filters) harmful substances out of the blood. °CAUSES  °Scientists do not know exactly why cells start to divide rapidly in the liver to form a tumor. It is known that certain behaviors and conditions (risk factors) make liver cancer more likely to develop. They include: °· Being male. Men older than 50 have liver cancer more often than other people do. °· Having scarring of the liver (cirrhosis). °¨ This occurs when liver cells are damaged and replaced with scar tissue. °¨ Heavy drinking of alcohol for many years can cause cirrhosis, as well as being infected with the hepatitis B or hepatitis C virus (HBV or HCV). HBV and HCV are spread through blood and sexual contact. °· Having certain liver diseases, such as: °¨ Hemochromatosis. This disease causes too much iron to be stored in the liver. °¨ Autoimmune hepatitis. In this disease, the body's immune system turns against the liver. °¨ Wilson disease. This disease happens when copper builds up in the liver. °· Having diabetes, particularly if you also drink alcohol heavily or have chronic viral hepatitis. °· Being overweight (obese). °· Exposure to aflatoxins. These are substances made by certain types of mold. They can form on peanuts, corn, wheat, soybeans, and rice. This is not a common cause of liver cancer in the [place] and [place] where foods are tested for aflatoxins. °SYMPTOMS  °Oftentimes, liver cancer  has few symptoms in the beginning. Sometimes, there are none. As the cancer grows, symptoms may include: °· Weight loss without dieting. °· Loss of appetite. °· Nausea. °· Feeling very weak and tired. °· Pain on the right side of the belly (abdomen). °· Feeling full or bloated. °· Running a fever for no reason. °· The skin or eyes become yellow in color (jaundice). °· Dark-colored urine. °DIAGNOSIS  °Your caregiver may suspect that you have liver cancer based on your symptoms and your physical exam. Other tests will likely be necessary. These may include: °· Blood tests, including a test called alpha fetoprotein (AFP). The blood contains more of this protein if someone has liver cancer. °· Imaging tests. These tests may be able to detect cancer or a tumor. These tests include CT scan, MRI scan, or ultrasound. °· Liver biopsy. Using medicine to numb your skin, a specialist can insert a needle into the liver and remove some cells. These cells are examined under a microscope to determine if cancer is present. This is the best way to be certain of the diagnosis. °TREATMENT  °Liver cancer can be treated many ways. The type of treatment will depend on: °· The stage of the cancer. °· Your age and overall health. °· The condition of the liver. This means how well it is working and whether cirrhosis is present. °Treatments can be used alone or in combination. Options include: °· Surgery. °¨ The part of the liver that has cancer may   be removed. °¨ The whole liver may be removed. It would be replaced with a healthy liver (liver transplant). °· Radiation. High-energy X-rays kill the cancer cells. °¨ External radiation beams rays from a machine outside the body. °¨ Internal radiation uses tiny spheres (like beads) that are put into the body. They give off radiation. °· Chemotherapy. This treatment uses drugs that kill cancer cells. Options include: °¨ Intravenous chemotherapy. Drugs are put into the blood to travel through the  body. °¨ Targeted chemotherapy. This uses a drug that kills liver cancer by blocking the cancer's blood supply. °¨ Chemoembolization. Drugs are injected directly into the liver to kill cancer cells. °· Cryoablation. This involves killing the cancer cells by freezing them. °· Radiofrequency ablation. This procedure kills the cancer cells by heating them with an electric current. °HOME CARE INSTRUCTIONS  °· Learn as much as you can about liver cancer. It is important to be an informed patient. °· Work closely with your caregivers. Fighting cancer takes a team approach. °· Take medicines for pain only as prescribed by your caregiver. Follow the directions carefully. Ask before taking any over-the-counter drugs. Be aware that medicines containing acetaminophen can add to liver damage. °· Pay attention to what you eat. Nutrition is an important part of recovering from liver cancer. Having this cancer can take away your appetite. So can some of the treatments for it. You might need to limit salt. It is also important to get the right amount of protein. Ask your caregiver for advice or talk with a nutritionist. °· Consider joining a support group. Learning to live with a serious health problem, such as liver cancer, can be difficult. Friends and family can help. Many people also find it helpful to talk with others who are going through the same things you are. Ask your caregiver for a list of groups in your area. °· Get rest. °· Do not drink alcoholic beverages at all. °· Get vaccinated against hepatitis A and hepatitis B. There is no vaccine for hepatitis C. If you are considered at risk for these diseases, get tested. °SEEK MEDICAL CARE IF:  °· You cannot eat because you feel sick to your stomach. °· Your abdomen or legs start to swell. Fluid might be building up. °· You feel weaker or more tired than usual. °· Your pain gets worse. °SEEK IMMEDIATE MEDICAL CARE IF:  °· Your pain in your abdomen increases suddenly. °· You  have nausea, vomiting, or diarrhea that does not go away. °· You feel confused. °· You feel very sleepy during the daytime. °· You have any bleeding that does not stop quickly. °· You have a fever. °Document Released: 06/30/2008 Document Revised: 06/28/2013 Document Reviewed: 06/30/2008 °[employer]® Patient Information ©2015 [employer]. This information is not intended to replace advice given to you by your health care provider. Make sure you discuss any questions you have with your health care provider. ° °

## 2013-10-08 NOTE — ED Provider Notes (Signed)
CSN: [id number]     Arrival date & time 10/08/13  1546 History   First MD Initiated Contact with Patient 10/08/13 1626     CC: Abdominal swelling HPI Pt has history of hepatocellular carcinoma.  His abdomen has become more swollen.  More so over the last couple of weeks.  His appetite has been decreasing because.  He has not had any vomiting.  He went to see his oncologist today.  He was sent to the ED to see the source of his swelling and to see if there is anything that could be done to assist with this.  He has constant discomfort but not a lot of sharp hard pain.    Past Medical History  Diagnosis Date  . Hypertension   . PAC (premature atrial contraction)   . PVC's (premature ventricular contractions)   . Tobacco dependence   . Hyperlipidemia   . Osteoarthritis   . Peripheral neuropathy   . Complication of anesthesia     ?, became limp after surgery -was hospitalized  . COPD (chronic obstructive pulmonary disease)   . Prostate cancer dx'd 2012    xrt  . Liver cancer dx'd 08/2013    w/ prob renal cell ca  . Diabetes mellitus    Past Surgical History  Procedure Laterality Date  . Replacement total knee    . Total shoulder replacement    . Cholecystectomy    . Cardiovascular stress test  03/27/2007    EF 51%   Family History  Problem Relation Age of Onset  . COPD Mother   . Stroke Father    History  Substance Use Topics  . Smoking status: Current Every Day Smoker -- 1.50 packs/day for 65 years  . Smokeless tobacco: Never Used  . Alcohol Use: No    Review of Systems  Constitutional: Negative for fever.  Respiratory: Negative for cough.   Cardiovascular: Negative for chest pain.  Gastrointestinal: Positive for nausea. Negative for vomiting.  All other systems reviewed and are negative.     Allergies  Ciprofloxacin and Codeine  Home Medications   Prior to Admission medications   Medication Sig Start Date End Date Taking? Authorizing Provider  clonazePAM  (KLONOPIN) 0.5 MG tablet Take 0.5 mg by mouth at bedtime.   Yes Historical Provider, MD  gabapentin (NEURONTIN) 300 MG capsule Take 300 mg by mouth 4 (four) times daily.   Yes Historical Provider, MD  meclizine (ANTIVERT) 25 MG tablet Take 25 mg by mouth 3 (three) times daily as needed for dizziness.   Yes Historical Provider, MD  metoprolol succinate (TOPROL-XL) 50 MG 24 hr tablet Take 50 mg by mouth daily. Take with or immediately following a meal.   Yes Historical Provider, MD  nitroGLYCERIN (NITROSTAT) 0.4 MG SL tablet Place 1 tablet (0.4 mg total) under the tongue every 5 (five) minutes as needed for chest pain. 01/25/13  Yes Susy Frizzle, MD  Omega-3 Fatty Acids (FISH OIL) 1200 MG CAPS Take 1,200 mg by mouth 3 (three) times daily.   Yes Historical Provider, MD  oxyCODONE (OXY IR/ROXICODONE) 5 MG immediate release tablet Take 2 tablets (10 mg total) by mouth every 6 (six) hours as needed for severe pain. 10/07/13  Yes Susy Frizzle, MD  OxyCODONE (OXYCONTIN) 15 mg T12A 12 hr tablet Take 1 tablet (15 mg total) by mouth every 12 (twelve) hours. 09/17/13  Yes Concha Norway, MD  tiotropium (SPIRIVA) 18 MCG inhalation capsule Place 18 mcg into inhaler and inhale  daily.   Yes Historical Provider, MD  traMADol (ULTRAM) 50 MG tablet Take 50-100 mg by mouth 2 (two) times daily. Take 1 tablet in the morning and 2 at bedtime.   Yes Historical Provider, MD  vitamin B-12 (CYANOCOBALAMIN) 1000 MCG tablet Take 1,000 mcg by mouth daily.   Yes Historical Provider, MD  VOLTAREN 1 % GEL Apply 1 application topically daily as needed (Knee pain). To knees 08/05/13  Yes Historical Provider, MD   BP 138/72  Pulse 66  Temp(Src) 97.9 F (36.6 C)  Resp 18  SpO2 99% Physical Exam  Nursing note and vitals reviewed. Constitutional: No distress.  HENT:  Head: Normocephalic and atraumatic.  Right Ear: External ear normal.  Left Ear: External ear normal.  Mouth/Throat: No oropharyngeal exudate.  Eyes: Conjunctivae  are normal. Right eye exhibits no discharge. Left eye exhibits no discharge.  Neck: Neck supple. No tracheal deviation present.  Cardiovascular: Normal rate, regular rhythm and intact distal pulses.   Pulmonary/Chest: Effort normal and breath sounds normal. No stridor. No respiratory distress. He has no wheezes. He has no rales.  Abdominal: Soft. Bowel sounds are normal. He exhibits mass. He exhibits no distension, no fluid wave and no ascites. There is tenderness in the right upper quadrant and epigastric area. There is no rebound and no guarding. No hernia.  Musculoskeletal: He exhibits no edema and no tenderness.  Neurological: He is alert. He has normal strength. No cranial nerve deficit (no facial droop, extraocular movements intact, no slurred speech) or sensory deficit. He exhibits normal muscle tone. He displays no seizure activity. Coordination normal.  Skin: Skin is warm and dry. No rash noted.  Psychiatric: He has a normal mood and affect.    ED Course  Procedures (including critical care time) Labs Review Labs Reviewed  CBC WITH DIFFERENTIAL - Abnormal; Notable for the following:    RDW 16.1 (*)    Lymphocytes Relative 11 (*)    Lymphs Abs 0.6 (*)    All other components within normal limits  COMPREHENSIVE METABOLIC PANEL - Abnormal; Notable for the following:    Sodium 134 (*)    Glucose, Bld 104 (*)    Albumin 2.4 (*)    AST 1046 (*)    ALT 108 (*)    Alkaline Phosphatase 309 (*)    GFR calc non Af Amer 54 (*)    GFR calc Af Amer 62 (*)    All other components within normal limits  LIPASE, BLOOD  URINALYSIS, ROUTINE W REFLEX MICROSCOPIC    Imaging Review Ct Abdomen Pelvis W Contrast  10/08/2013   CLINICAL DATA:  Abdominal distention.  History of liver carcinoma.  EXAM: CT ABDOMEN AND PELVIS WITH CONTRAST  TECHNIQUE: Multidetector CT imaging of the abdomen and pelvis was performed using the standard protocol following bolus administration of intravenous contrast.   CONTRAST:  188mL OMNIPAQUE IOHEXOL 300 MG/ML  SOLN  COMPARISON:  CT, 09/27/2013.  FINDINGS: Liver is enlarged, most notably the right lobe, which is mostly replaced by a conglomeration of masses. Other ill-defined masses extending to the medial segment of the left lobe with a few in the lateral segment of the left lobe. These findings are stable from the recent exam, consistent with multifocal hepatocellular carcinoma. Liver measures 31.5 cm in greatest transverse dimension.  There is a small amount of ascites also without significant change from the prior study.  Minor subsegmental atelectasis the lung bases. Heart is normal in size.  Gallbladder surgically absent. Spleen is  unremarkable. Pancreas is unremarkable. There is a small diverticulum from the second portion of the duodenum. Coil is seen in the gastric duodenal artery in preparation for pre-Y90 treatment of the liver disease.  No adrenal masses. Bilateral renal cysts and renal cortical thinning. Diffuse aorto iliac vascular calcifications. No aneurysm. There are several prominent retroperitoneal nodes, stable.  Scattered left colon diverticula. No diverticulitis. Bowel is otherwise unremarkable.  There are degenerative changes throughout the visualized spine and of the left hip. No osteoblastic or osteolytic lesions.  IMPRESSION: 1. No significant change in the extent of liver disease since the recent exam. Small amount of ascites, also without significant change. 2. New metal coil in the gastric duodenal artery in preparation for Y90 treatment of the liver disease. No other change from the recent prior exam. No acute finding.   Electronically Signed   By: Lajean Manes M.D.   On: 10/08/2013 18:27    Medications  morphine 4 MG/ML injection 4 mg (4 mg Intravenous Given 10/08/13 1719)  ondansetron (ZOFRAN) injection 4 mg (4 mg Intravenous Given 10/08/13 1718)  iohexol (OMNIPAQUE) 300 MG/ML solution 100 mL (100 mLs Intravenous Contrast Given 10/08/13 1800)     MDM   Final diagnoses:  HCC (hepatocellular carcinoma)    Pt states his pain is controlled.  He does not need to be admitted for pain management.  Family was hoping that he was going to have ascites that could be drained to offer him more comfort.  CT scan does not show an acute change.  Only a small amount of ascites.  Symptoms are likely related to his tumor burden,    Dorie Rank, MD 10/08/13 [phone]

## 2013-10-08 NOTE — Patient Instructions (Signed)
Sorafenib Oral Tablet  What is this medicine?  SORAFENIB (soe RAF e nib) is a chemotherapy drug. It targets specific proteins within cancer cells and stops the cancer cell from growing. This medicine is used to treat liver cancer and kidney cancer.  This medicine may be used for other purposes; ask your health care provider or pharmacist if you have questions.  COMMON BRAND NAME(S): Nexavar  What should I tell my health care provider before I take this medicine?  They need to know if you have any of these conditions:  -bleeding problems  -heart disease  -high blood pressure  -kidney disease  -liver disease  -lung cancer  -recent surgery  -an unusual or allergic reaction to sorafenib, other medicines, foods, dyes, or preservatives  -pregnant or trying to get pregnant  -breast-feeding  How should I use this medicine?  Take this medicine by mouth with a glass of water. Follow the directions on the prescription label. Do not cut, crush or chew this medicine. Take this medicine on an empty stomach, at least 1 hour before or 2 hours after meals. Do not take with food. Take your medicine at regular intervals. Do not take it more often than directed. Do not stop taking except on your doctor's advice.  Talk to your pediatrician regarding the use of this medicine in children. Special care may be needed.  Overdosage: If you think you have taken too much of this medicine contact a poison control center or emergency room at once.  NOTE: This medicine is only for you. Do not share this medicine with others.  What if I miss a dose?  If you miss a dose, take it as soon as you can. If it is almost time for your next dose, take only that dose. Do not take double or extra doses.  What may interact with this medicine?  This medicine may interact with the following medications:  -carbamazepine  -dexamethasone  -medicines for seizures like carbamazepine, phenobarbital, phenytoin  -neomycin  -rifabutin  -rifampin  -St. John's  Wort  -warfarin  This list may not describe all possible interactions. Give your health care provider a list of all the medicines, herbs, non-prescription drugs, or dietary supplements you use. Also tell them if you smoke, drink alcohol, or use illegal drugs. Some items may interact with your medicine.  What should I watch for while using this medicine?  This drug may make you feel generally unwell. This is not uncommon, as chemotherapy can affect healthy cells as well as cancer cells. Report any side effects. Continue your course of treatment even though you feel ill unless your doctor tells you to stop.  Men and women should use effective birth control while taking this medicine and for 2 weeks after stopping this medicine. Do not become pregnant while taking this medicine. Women should inform their doctor if they wish to become pregnant or think they might be pregnant. There is a potential for serious side effects to an unborn child. Talk to your health care professional or pharmacist for more information. Do not breast-feed an infant while taking this medicine.  If you are going to have surgery or any other procedures, tell your doctor you are taking this medicine.  What side effects may I notice from receiving this medicine?  Side effects that you should report to your doctor or health care professional as soon as possible:  -allergic reactions like skin rash, itching or hives, swelling of the face, lips, or tongue  -  black, tarry stools  -breathing problems  -chest pain or chest tightness  -dark urine  -dizziness  -fast or irregular heartbeat  -feeling faint or lightheaded  -high fever  -light-colored stools  -nausea, vomiting  -redness, blistering, peeling or loosening of the skin, including inside the mouth  -right upper belly pain  -sores on the hands or feet  -spitting up blood or brown material that looks like coffee grounds  -stomach pain  -yellowing of the eyes or skinSide effects that usually do not  require medical attention (report to your doctor or health care professional if they continue or are bothersome):  -diarrhea  -hair loss  -loss of appetite  -tiredness  -weight loss  This list may not describe all possible side effects. Call your doctor for medical advice about side effects. You may report side effects to FDA at 1-800-FDA-1088.  Where should I keep my medicine?  Keep out of the reach of children.  Store at room temperature between 15 and 30 degrees C (59 and 86 degrees F). Protect from moisture. Throw away any unused medicine after the expiration date.  NOTE: This sheet is a summary. It may not cover all possible information. If you have questions about this medicine, talk to your doctor, pharmacist, or health care provider.   2015, [employer]. (2010-12-28 14:11:09)

## 2013-10-08 NOTE — ED Notes (Signed)
Pts family made aware of need for urine specimen and given urinal for pt. Pt in CT at the moment

## 2013-10-10 DIAGNOSIS — R109 Unspecified abdominal pain: Secondary | ICD-10-CM | POA: Insufficient documentation

## 2013-10-10 NOTE — Progress Notes (Signed)
[facility] OFFICE PROGRESS NOTE  Odette Fraction, MD [address] [place] [address]  DIAGNOSIS: [place] (hepatocellular carcinoma) - Plan: CBC with Differential, Comprehensive metabolic panel (Cmet) - [facility], Lactate dehydrogenase (LDH) - [facility]  Weakness generalized  Tobacco dependence  Abdominal pain, unspecified site  Chief Complaint  Patient presents with  . Follow-up    CURRENT TREATMENT: Planning Y-90 per interventional radiology. Ultrasound guided biopsy on 09/20/2013.   INTERVAL HISTORY: Lonnie Barron 80 y.o. male  who was referred for further evaluation of his newly diagnosed [place] by imaging and seen initially on 09/20/2013. He is here for follow up. Today, he is accompanied by his sons and his wife.  As previously reported, he was referred by Dr. Jenna Luo. He was last seen by Dr. Dennard Schaumann on 08/20/2013. He had a few weeks of epigastric abdominal pain and sharp occasional right upper quandrant abdominal pain. He denied any melena or hematochezia and notes a piror cholecystectomy about 10 years ago. As part of his abdominal pain workup, he had CT abdomen pelvis with contrast and was referred to gastroenterology. His CT of abdomen pelvis with constrast on 08/24/2013 demonstrated a mass superioly within the right lobe of the liver, 6.9 x 4.9 x 6.8 cm, highly suspicous for hepatocellular carcinoma in the setting of a suspected cirrhotic liver. In addition, thrombus was identified with in left portal vein and occluding the right portal vein, likely representing tumor thrombus thou bland thrombus was not excludeed. It also revealed an enhancing nodule at lateral left kidney 1.8 x 1.7 cm suspcious for a small renal neoplasm. An MRI was recommended. It was obtained on 08/29/2013, revealing a large complex liver mass involving almost the entire right hepatic lobe and small associated lesions in the medial segment of the left hepatic lobe. This was most likely a  hepatocellular carcinoma. 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 A biopsy was recommended. There were bordeline enlarged upper abdominal lymph nodes. There were simple and complex cysts associated with the left kidney. His AFP was 1,463 on 08/31/2013. His acute hepatitis panel was negative on 08/31/2013. His LFTs on 08/20/2013 revealed AST of 270, ALT of 56, and albumin of 4. And total bilirubin of 0.8 and creatinine of 1.04.   Today, his family reports being evaluated by IR for Y-90 and it was determined that he was not a candidate.  He was referred to hospice and was enrolled on yesterday.   He had an interim CT of abdomen as noted below.  He complains of intense, sharp abdominal pain and discomfort.  He rates it 10 out of 10.  He is taking oxycodone 10 mg every 6 hours as needed for the pain  with minimal relief.  He denies any hospitalizations or emergency room visits. He denies fevers or chills.   MEDICAL HISTORY: Past Medical History  Diagnosis Date  . Hypertension   . PAC (premature atrial contraction)   . PVC's (premature ventricular contractions)   . Tobacco dependence   . Hyperlipidemia   . Osteoarthritis   . Peripheral neuropathy   . Complication of anesthesia     ?, became limp after surgery -was hospitalized  . COPD (chronic obstructive pulmonary disease)   . Prostate cancer dx'd 2012    xrt  . Liver cancer dx'd 08/2013    w/ prob renal cell ca  . Diabetes mellitus     INTERIM HISTORY: has Hypertension; PAC (premature atrial contraction); PVC's (premature ventricular contractions); Tobacco  dependence; Hyperlipidemia; COPD with exacerbation; Bronchitis, acute; Weakness generalized; Gait disorder; Syncope; Nodule of parotid gland; HCC (hepatocellular carcinoma); and Abdominal pain, unspecified site on his problem list.    ALLERGIES:  is allergic to ciprofloxacin and codeine.  MEDICATIONS: has a current medication list which includes the following prescription(s):  clonazepam, nitroglycerin, fish oil, oxycodone, oxycodone, tiotropium, tramadol, vitamin b-12, voltaren, gabapentin, meclizine, and metoprolol succinate.  SURGICAL HISTORY:  Past Surgical History  Procedure Laterality Date  . Replacement total knee    . Total shoulder replacement    . Cholecystectomy    . Cardiovascular stress test  03/27/2007    EF 51%    REVIEW OF SYSTEMS:   Constitutional: Denies fevers, chills or abnormal weight loss Eyes: Denies blurriness of vision Ears, nose, mouth, throat, and face: Denies mucositis or sore throat Respiratory: Denies cough, dyspnea or wheezes Cardiovascular: Denies palpitation, chest discomfort or lower extremity swelling Gastrointestinal:  Denies nausea, heartburn or change in bowel habits Skin: Denies abnormal skin rashes Lymphatics: Denies new lymphadenopathy or easy bruising Neurological:Denies numbness, tingling or new weaknesses Behavioral/Psych: Mood is stable, no new changes  All other systems were reviewed with the patient and are negative.  PHYSICAL EXAMINATION: ECOG PERFORMANCE STATUS: 3 - Symptomatic, >50% confined to bed  Blood pressure 131/77, pulse 73, temperature 97.6 F (36.4 C), temperature source Oral, resp. rate 18, height 6\' 2"  (1.88 m), weight 196 lb 6.4 oz (89.086 kg).  ECOG 3:  GENERAL:alert, no distress and comfortable; chronically ill appearing sitting in wheelchair  SKIN: skin color, texture, turgor are normal, no rashes or significant lesions  EYES: normal, Conjunctiva are pink and non-injected, sclera clear  OROPHARYNX:no exudate, no erythema and lips, buccal mucosa, and tongue normal  NECK: supple, thyroid normal size, non-tender, without nodularity  LYMPH: no palpable lymphadenopathy in the cervical, axillary or inguinal  LUNGS: clear to auscultation and percussion with normal breathing effort  HEART: regular rate & rhythm and no murmurs and no lower extremity edema  ABDOMEN:abdomen soft, Tender to  palpation in RUQ and normal bowel sounds; Positive for distention.  No stigmata of liver disease. Negative for fluid wave.  Musculoskeletal:no cyanosis of digits and no clubbing  NEURO: alert & oriented x 3 with fluent speech, no focal motor/sensory deficits; no asterexis.   Labs:  Lab Results  Component Value Date   WBC 6.0 10/08/2013   HGB 14.5 10/08/2013   HCT 43.3 10/08/2013   MCV 96.7 10/08/2013   PLT 197 10/08/2013   NEUTROABS 4.5 10/08/2013      Chemistry      Component Value Date/Time   NA 134* 10/08/2013 1720   NA 136 09/17/2013 1421   K 4.9 10/08/2013 1720   K 4.6 09/17/2013 1421   CL 96 10/08/2013 1720   CO2 26 10/08/2013 1720   CO2 23 09/17/2013 1421   BUN 21 10/08/2013 1720   BUN 24.8 09/17/2013 1421   CREATININE 1.21 10/08/2013 1720   CREATININE 1.1 09/17/2013 1421   CREATININE 1.04 08/20/2013 1011      Component Value Date/Time   CALCIUM 9.7 10/08/2013 1720   CALCIUM 9.7 09/17/2013 1421   ALKPHOS 309* 10/08/2013 1720   ALKPHOS 298* 09/17/2013 1421   AST 1046* 10/08/2013 1720   AST 439* 09/17/2013 1421   ALT 108* 10/08/2013 1720   ALT 52 09/17/2013 1421   BILITOT 1.1 10/08/2013 1720   BILITOT 1.51* 09/17/2013 1421       Basic Metabolic Panel:  Recent Labs Lab 10/08/13 1720  NA 134*  K 4.9  CL 96  CO2 26  GLUCOSE 104*  BUN 21  CREATININE 1.21  CALCIUM 9.7   GFR Estimated Creatinine Clearance: 53.8 ml/min (by C-G formula based on Cr of 1.21). Liver Function Tests:  Recent Labs Lab 10/08/13 1720  AST 1046*  ALT 108*  ALKPHOS 309*  BILITOT 1.1  PROT 6.5  ALBUMIN 2.4*    Recent Labs Lab 10/08/13 1720  LIPASE 15   No results found for this basename: AMMONIA,  in the last 168 hours Coagulation profile No results found for this basename: INR, PROTIME,  in the last 168 hours  CBC:  Recent Labs Lab 10/08/13 1720  WBC 6.0  NEUTROABS 4.5  HGB 14.5  HCT 43.3  MCV 96.7  PLT 197    Anemia work up No results found for this basename: VITAMINB12,  FOLATE, FERRITIN, TIBC, IRON, RETICCTPCT,  in the last 72 hours  Studies:  Ct Abdomen Pelvis W Contrast  10/08/2013   CLINICAL DATA:  Abdominal distention.  History of liver carcinoma.  EXAM: CT ABDOMEN AND PELVIS WITH CONTRAST  TECHNIQUE: Multidetector CT imaging of the abdomen and pelvis was performed using the standard protocol following bolus administration of intravenous contrast.  CONTRAST:  157mL OMNIPAQUE IOHEXOL 300 MG/ML  SOLN  COMPARISON:  CT, 09/27/2013.  FINDINGS: Liver is enlarged, most notably the right lobe, which is mostly replaced by a conglomeration of masses. Other ill-defined masses extending to the medial segment of the left lobe with a few in the lateral segment of the left lobe. These findings are stable from the recent exam, consistent with multifocal hepatocellular carcinoma. Liver measures 31.5 cm in greatest transverse dimension.  There is a small amount of ascites also without significant change from the prior study.  Minor subsegmental atelectasis the lung bases. Heart is normal in size.  Gallbladder surgically absent. Spleen is unremarkable. Pancreas is unremarkable. There is a small diverticulum from the second portion of the duodenum. Coil is seen in the gastric duodenal artery in preparation for pre-Y90 treatment of the liver disease.  No adrenal masses. Bilateral renal cysts and renal cortical thinning. Diffuse aorto iliac vascular calcifications. No aneurysm. There are several prominent retroperitoneal nodes, stable.  Scattered left colon diverticula. No diverticulitis. Bowel is otherwise unremarkable.  There are degenerative changes throughout the visualized spine and of the left hip. No osteoblastic or osteolytic lesions.  IMPRESSION: 1. No significant change in the extent of liver disease since the recent exam. Small amount of ascites, also without significant change. 2. New metal coil in the gastric duodenal artery in preparation for Y90 treatment of the liver disease. No  other change from the recent prior exam. No acute finding.   Electronically Signed   By: Lajean Manes M.D.   On: 10/08/2013 18:27   08/06/2015NUCLEAR MEDICINE LIVER SCAN with SPECT  TECHNIQUE:  Abdominal images were obtained in multiple projections after  intrahepatic arterial injection of radiopharmaceutical. SPECT  imaging was performed. Lung shunt calculation was performed.  RADIOPHARMACEUTICALS: 5MILLI CURIE MAA TECHNETIUM TO 47M ALBUMIN AGGREGATED COMPARISON: CT 09/27/2013 FINDINGS: The injected in a localize is within the liver with no evidence of extrahepatic deposition in the abdomen.  There is however it extensive shunting with enlarged fraction the lungs. Calculated shunt fraction to the lungs equals 24%. IMPRESSION: 1. No significant extrahepatic radiotracer activity in the abdomen following intrahepatic arterial injection of MAA. 2. High Lung shunt fraction equals 24% Findings conveyed toART HOSS on 09/30/2013 at13:43.  CT Angio abdomen with contrast 09/27/2013  CT ANGIOGRAPHY ABDOMEN  TECHNIQUE:  Multidetector CT imaging of the abdomen was performed using the standard protocol during bolus administration of intravenous  contrast. Multiplanar reconstructed images including MIPs were  obtained and reviewed to evaluate the vascular anatomy.  CONTRAST: 122mL OMNIPAQUE IOHEXOL 350 MG/ML SOLN  COMPARISON: CT the abdomen pelvis - 08/24/2013; abdominal MRI - 08/29/2013; ultrasound-guided liver lesion biopsy 09/20/2013 FINDINGS: Vascular Findings: Abdominal aorta: There is a moderate amount of mixed calcified and  noncalcified atherosclerotic plaque within a normal caliber thoracic aorta. No abdominal aortic aneurysm or dissection. No periaortic stranding. Celiac artery: There is a very minimal amount of mixed calcified and noncalcified atherosclerotic plaque involving the origin and proximal aspect of the celiac artery, not resulting in a hemodynamically significant stenosis. Conventional  branching pattern, though note, the left hepatic artery is noted to arise from the proper hepatic artery opposite the takeoff of the GDA. The right gastric artery is not definitely identified.  Right inferior phrenic: The right inferior phrenic artery arises  from the left side of the abdominal aorta immediately cranial to the takeoff of the celiac artery (coronal image 88, series 602 with at least partial supply to the mass involving the dome of the right  lobe of the liver (representative axial image 24, series 5).  Portal vein: There is complete occlusion of the right portal vein  with enhancing nonocclusive malignant thrombus extending into the distal aspects of the main portal vein (axial image 29, series 7) and into the central aspect of the left portal vein (image 23,  series 7), grossly unchanged. The splenic vein and SMV remain  patent. SMA: There is a minimal amount of eccentric noncalcified  atherosclerotic plaque involving the proximal aspect of the SMA  (representative image 48, series 5, not definitely resulting in a  hemodynamically significant stenosis. Conventional branching  pattern. Right Renal artery: Solitary ; there is a mixture of calcified and noncalcified atherosclerotic plaque involving the origin of the right renal artery, not resulting in hemodynamically significant stenosis. Left Renal artery: Solitary; there is a minimal amount of eccentric primarily calcified atherosclerotic plaque involving the caudal aspect of the origin of the left renal artery, not resulting in hemodynamically significant stenosis. IMA: Patent throughout its imaged course. Review of the MIP images confirms the above findings.  --------------------------------------------------------------------------------  Nonvascular Findings:  Hepatic contour remains slightly nodular. Grossly unchanged  appearance of extensive infiltrative mass nearly replacing the  entirety of the enlarged right lobe of the  liver - exact  measurements of this infiltrative mass are difficult though measure at least 20.6 x 13.3 x 19.3 cm as measured in greatest oblique axial (image 52, series 5) and coronal (image 61, series 602) dimensions respectively. There has been interval development of a small to moderate volume intra-abdominal ascites. Symmetric enhancement of the bilateral kidneys. Note is made of several left-sided renal cysts. Right-sided hypo attenuating renal lesions are too small to adequately characterize of favored to represent additional renal cysts. No definite renal stones on this postcontrast examination. Normal appearance of the bilateral adrenal glands and pancreas. The spleen is borderline enlarged measuring 16 cm in length. Scattered colonic diverticulosis without evidence of diverticulitis. The imaged loops of bowel are otherwise normal in course and caliber. A small duodenal diverticulum is suspected. No pneumoperitoneum, pneumatosis or portal venous gas. Retroperitoneal adenopathy is demonstrated within the imaged upper abdomen with index left-sided periaortic lymph node measuring 1.0 cm in greatest short axis diameter (image  59, series 5). Imaged loops of bowel are normal in course and caliber. Limited visualization of the lower thorax demonstrates a punctate (approximately 2 mm) noncalcified subpleural nodule within image caudal aspect of the lingula (image 8, series 10). There is minimal subsegmental atelectasis within the left costophrenic angle. No discrete focal airspace opacities. No pleural effusion. Normal heart size. Coronary artery calcifications. No pericardial effusion. No acute or aggressive osseus abnormalities. Stigmata of DISH within  the imaged caudal aspect of the thoracic as well as the superior  aspect of the lumbar spine. Moderate to severe multilevel DDD  throughout the lumbar spine. Regional soft tissues appear normal. IMPRESSION: 1. Conventional hepatic arterial supply as above. 2.  Grossly unchanged infiltrative mass nearly replacing the entirety of the enlarged right lobe of the liver, measuring at least 20.6 cm in greatest diameter, with associated retroperitoneal adenopathy. 3. Interval development of small to moderate volume intra-abdominal ascites. 4. Grossly unchanged occlusive thrombosis of the right portal vein with enhancing malignant thrombus involving the distal aspect of the main portal and peripheral aspect of the left portal veins. 5. Indeterminate punctate (approximately 2 mm) noncalcified subpleural nodule within the lingula. Continued attention on follow-up is recommended.   Mr Abdomen W Wo Contrast  08/29/2013   CLINICAL DATA:  Liver mass.  EXAM: MRI ABDOMEN WITHOUT AND WITH CONTRAST  TECHNIQUE: Multiplanar multisequence MR imaging of the abdomen was performed both before and after the administration of intravenous contrast.  CONTRAST:  9 cc Eovist  COMPARISON:  CT scan 08/24/2013  FINDINGS: As demonstrated on the CT scan there is a large complex mass involving almost the entire right hepatic lobe and small associated lesions in the medial segment of the left hepatic lobe. This is most likely a hepatocellular carcinoma. Also demonstrated on the CT scan is portal vein thrombosis. The middle and right portal veins are not visualized and likely occluded with tumor/clot. There is also tumor thrombus in the left portal vein but it is not completely occluded. The main portal vein is patent as is the splenic vein.  There are borderline enlarged portal, celiac axis and retroperitoneal lymph nodes. The adrenal glands are normal. There are bilateral simple appearing renal cysts and a complex cyst associated with the lower pole region of the left kidney which appears hemorrhagic.  The aorta and branch vessels are patent. There are degenerative changes involving the spine but no destructive bone lesions.  IMPRESSION: 1. Large complex liver mass as described above. There is associated  intrahepatic portal vein thrombosis. Recommend biopsy. 2. Borderline enlarged upper abdominal lymph nodes. 3. Simple and complex cysts associated with the left kidney.   Electronically Signed   By: Kalman Jewels M.D.   On: 08/29/2013 19:53   Ct Abdomen Pelvis W Contrast  08/24/2013   CLINICAL DATA:  30 pounds weight loss in 5 months, anorexia, mid abdominal pain, occasional blood in stool, history prostate cancer, renal calculi, hypertension, smoking, diabetes, COPD  EXAM: CT ABDOMEN AND PELVIS WITH CONTRAST  TECHNIQUE: Multidetector CT imaging of the abdomen and pelvis was performed using the standard protocol following bolus administration of intravenous contrast. Sagittal and coronal MPR images reconstructed from axial data set.  CONTRAST:  153mL OMNIPAQUE IOHEXOL 300 MG/ML SOLN IV. Dilute oral contrast.  COMPARISON:  10/15/2005  FINDINGS: Lung bases clear.  Filling defect/thrombus identified in LEFT portal vein with occlusion of RIGHT portal vein.  Splenic and superior mesenteric veins patent.  Heterogeneous attenuation of RIGHT lobe of liver may be related  to portal vein thrombosis. Additionally, larger area of heterogeneous decreased attenuation is seen superiorly in the RIGHT lobe at dome extending to the central RIGHT lobe, up to 6.9 x 4.9 cm image 15 and 6.8 cm length, highly suspicious for neoplasm.  Enlargement of lateral segment LEFT lobe liver and caudate lobe suggesting underlying cirrhosis.  Spleen mildly enlarged, 14.1 cm length.  No focal abnormalities of the spleen, pancreas, or adrenal glands.  Tiny exophytic nodule upper pole RIGHT kidney 8 mm diameter image 30, indeterminate, previously 5 mm.  Small LEFT renal cysts.  Additional intermediate attenuation nodule exophytic at lateral LEFT kidney image 36, 1.8 x 1.7 cm, demonstrating washout on delayed images question neoplasm.  Scattered atherosclerotic calcifications aorta.  Stomach incompletely distended, unable to adequately assess wall  thickness.  Normal appendix.  Large and small bowel loops otherwise normal appearance.  Surgical clips versus seeds at the prostate bed.  Bladder and ureters unremarkable.  No additional mass, adenopathy, free fluid or inflammatory process.  Multilevel degenerative disc and facet disease changes lumbar spine.  IMPRESSION: Mass superiorly within the RIGHT lobe of the liver, 6.9 x 4.9 x 6.8 cm, highly suspicious for hepatocellular carcinoma in the setting of a suspected cirrhotic liver.  Thrombus identified with in LEFT portal vein and occluding the RIGHT portal vein, likely representing tumor thrombus though bland thrombus is not excluded.  Recommend MR imaging with and without contrast for further assessment.  Enhancing nodule at lateral LEFT kidney 1.8 x 1.7 cm suspicious for a small renal neoplasm.  Findings called to Dr. Dennard Schaumann on 08/24/2013 at 1625 hours.   Electronically Signed   By: Lavonia Dana M.D.   On: 08/24/2013 16:28   PATHOLOGY Diagnosis Liver, needle/core biopsy - HEPATOCELLULAR CARCINOMA. - PLEASE SEE COMMENT. Microscopic Comment There are malignant cells arranged in acinar pattern. Immunohistochemical stains were performed and the malignant cells show the following immunoprofile: AFP - Patchy positive Hepar-1 - Negative CD10 - Negative Polyclonal CEA: positive . Cytokeratin 7 - Negative Cytokeratin 20 - Negative TTF-1 - Negative Napsin-A - Negative CDX2 - Negative. PSA - Negative The control strained appropriately. The overall findings are diagnostic for hepatocellular carcinoma. Dr. Donato Heinz agrees.(HCL:kh 09-22-13) Aldona Bar MD Pathologist, Electronic Signature (Case signed 09/23/2013)  ASSESSMENT: Lonnie Barron 80 y.o. male with a history of [place] (hepatocellular carcinoma) - Plan: CBC with Differential, Comprehensive metabolic panel (Cmet) - [facility], Lactate dehydrogenase (LDH) - [facility]  Weakness generalized  Tobacco dependence  Abdominal pain, unspecified  site   PLAN:  1. Unresectable, locally advanced HCC complicated by portal vein thrombus and severe abdominal discomfort/pain.  --We completed staging with CT of chest without contrast to assess for additional evidence of disease which was negative. Again, we reviewed imaging including the CT of abdomen/pelvis and MRI and CT Angio of the abdomen consistent with large hcc. This was confirmed by biopsy.  He was evaluated for Y-90 and determined not to be a candidate based on nuclear medicine liver scan with spect demonstrating shunting to the lungs. His have succesful coil occlusion of the gastroduodenal artery.    -- We discussed started sorafenib 200 mg daily (dosed based on current liver function) versus best supportive care.  Given his rapid progression and ECOG 3, we favored best supportive care.  He was evaluated by hospice and determined to be a candidate.  We provided a handout on sorafenib and discussed its indications, benefits and side effects in detail (as listed on handouts).  He requested evaluation for  severe abdominal pain to determine if a paracentesis would provide relief.  He was then referred to the ER for consideration of imaging followed by paracetesis.  He was evaluated by ER with CT as noted above and without enough ascites to warrant a paracentesis.    2. Left renal lesion, complex.  --Followed by imaging.   3. Weight Lost, ECOG 3  --He is rapidly declining and given his age and the above, [facility] is preferred.   4. Elevated liver function tests due to #1.  --AST 1046; ALT 108; Albumin is 2.4.  5.  Follow up.  --Patient will return to clinic in 1 week with labs prior to these visit to continue discussion about BSC.  All questions were answered. The patient knows to call the clinic with any problems, questions or concerns. We can certainly see the patient much sooner if necessary.  I spent 25 minutes counseling the patient face to face. The total time spent in the appointment  was 40 minutes.    Ariez Neilan, MD 10/10/2013 8:17 AM

## 2013-10-11 ENCOUNTER — Other Ambulatory Visit: Payer: Self-pay | Admitting: Family Medicine

## 2013-10-11 ENCOUNTER — Ambulatory Visit: Payer: Medicare Other

## 2013-10-12 ENCOUNTER — Ambulatory Visit (HOSPITAL_COMMUNITY): Payer: Medicare Other

## 2013-10-12 ENCOUNTER — Encounter (HOSPITAL_COMMUNITY): Payer: Medicare Other

## 2013-10-12 ENCOUNTER — Other Ambulatory Visit (HOSPITAL_COMMUNITY): Payer: Medicare Other

## 2013-10-14 ENCOUNTER — Telehealth: Payer: Self-pay | Admitting: Medical Oncology

## 2013-10-14 NOTE — Telephone Encounter (Signed)
Lonnie Barron with hospice called stating that pt has increased pressure and feels short of breath. His oxygen saturation is good. He has 2+/3+ edema in his left leg and some swelling in his right leg. She is not sure if this due to fluid collection and he needs to have a paracentesis. I spoke with Dr. Juliann Mule and he states pt just had a CT 8/20 and it showed very little ascites. He has a huge tumor burden and he feels the pressure is coming from the tumor. Dr. Juliann Mule is willing to make any adjustment in medications to make the patient more comfortable. I left this information on Karen's voicemail and I asked her to call me back if we need to make any changes.

## 2013-10-15 ENCOUNTER — Ambulatory Visit: Payer: Medicare Other

## 2013-10-15 ENCOUNTER — Other Ambulatory Visit: Payer: Medicare Other

## 2013-10-22 ENCOUNTER — Other Ambulatory Visit: Payer: Self-pay | Admitting: Medical Oncology

## 2013-10-22 ENCOUNTER — Encounter: Payer: Self-pay | Admitting: Medical Oncology

## 2013-10-22 ENCOUNTER — Telehealth: Payer: Self-pay | Admitting: Medical Oncology

## 2013-10-22 MED ORDER — MORPHINE SULFATE (CONCENTRATE) 20 MG/ML PO SOLN: ORAL | Status: AC

## 2013-10-22 MED ORDER — LORAZEPAM 0.5 MG PO TABS: ORAL_TABLET | ORAL | Status: AC

## 2013-10-22 NOTE — Telephone Encounter (Signed)
Karen-hospice nurse called stating that pt needs something stronger for pain. He is declining. He had a fall earlier in the week and since has began to decline. He can not hold a cup, is more confusion and agitation.She would like to have Dr. Juliann Mule prescribe roxanol and ativan. I spoke with Dr. Juliann Mule and he is ok. Prescription written and faxed to [facility].

## 2013-10-28 ENCOUNTER — Telehealth: Payer: Self-pay

## 2013-11-25 NOTE — Telephone Encounter (Signed)
Received fax about pt death at 735 am on 2013/11/08. Copy to Dr Lona Kettle, copy to HIM

## 2013-11-25 DEATH — deceased

## 2015-05-19 IMAGING — CT CT ANGIO ABDOMEN
3 of 10 series · 9 of 46 positions shown, 15 images · IV contrast (OMNIPAQUE)
Comparison: CT the abdomen pelvis - 08/24/2013; abdominal MRI -
08/29/2013; ultrasound-guided liver lesion biopsy - 09/20/2013

CLINICAL DATA: History of hepatocellular carcinoma. Please evaluate
hepatic arterial vascular supply prior to potential [AGE]
transcatheter treatment.

EXAM:
CT ANGIOGRAPHY ABDOMEN
TECHNIQUE: Multidetector CT imaging of the abdomen was performed using the
standard protocol during bolus administration of intravenous
contrast. Multiplanar reconstructed images including MIPs were
obtained and reviewed to evaluate the vascular anatomy.
CONTRAST:  100mL OMNIPAQUE IOHEXOL 350 MG/ML SOLN

[Series 5: arterial · axial · arterial · 0.85mm/px · z∈[-278,-236]mm · 2 of 95 slices shown]
[im 8/95  soft-tissue]
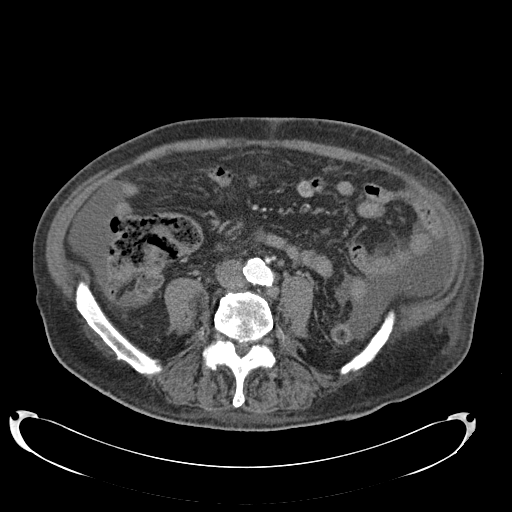
[im 22/95  soft-tissue]
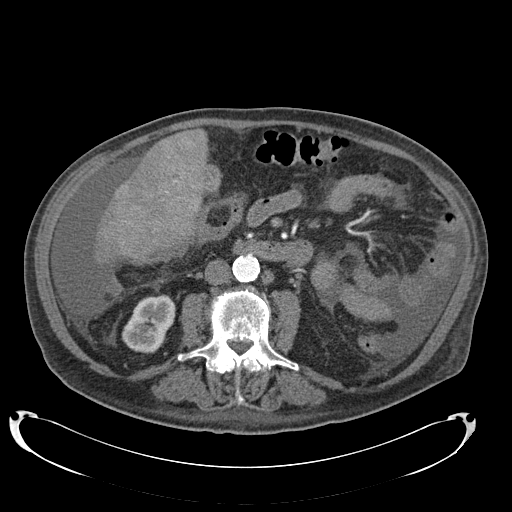

[Series 7: venous · axial · portal-venous · 0.85mm/px · z∈[-260,-60]mm · 6 of 58 slices shown, 11 images]
[im 9/58  soft-tissue]
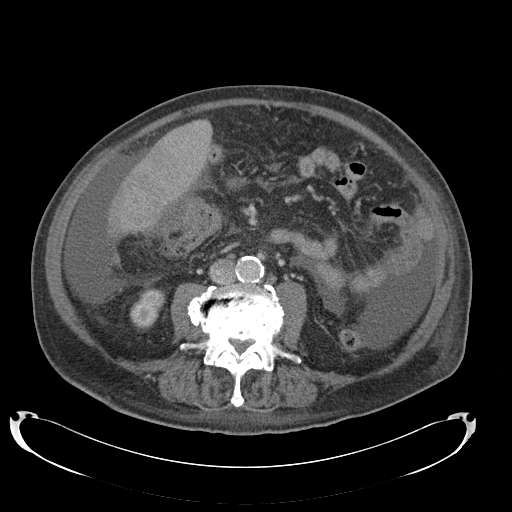
[im 9/58  bone]
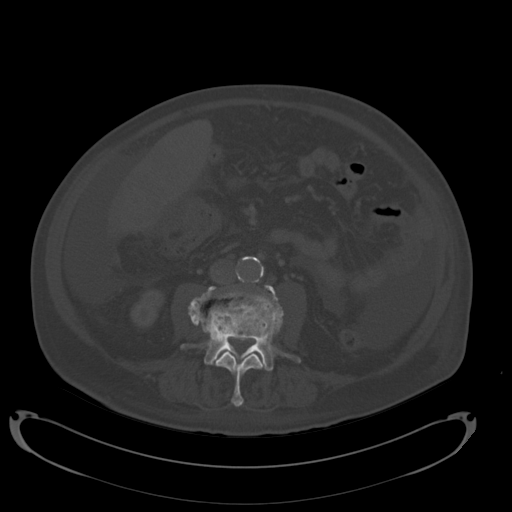
[im 17/58  soft-tissue]
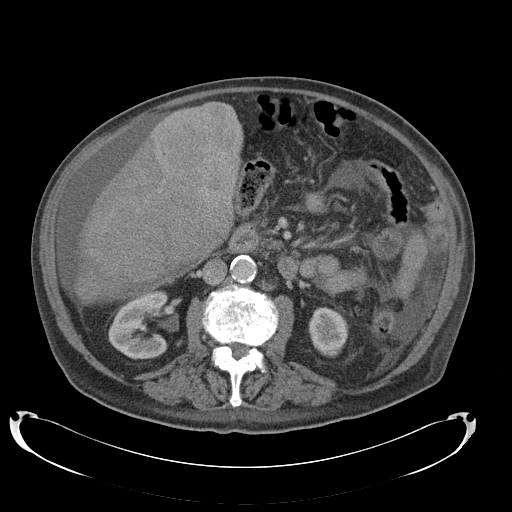
[im 25/58  soft-tissue]
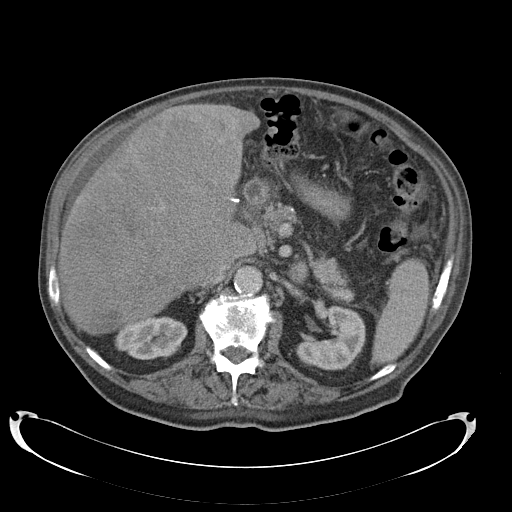
[im 25/58  lung]
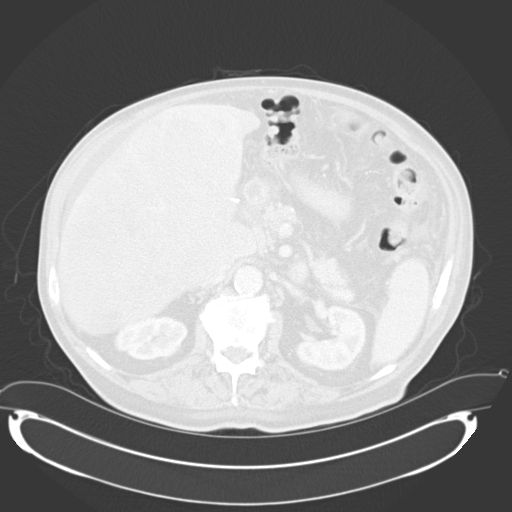
[im 33/58  soft-tissue]
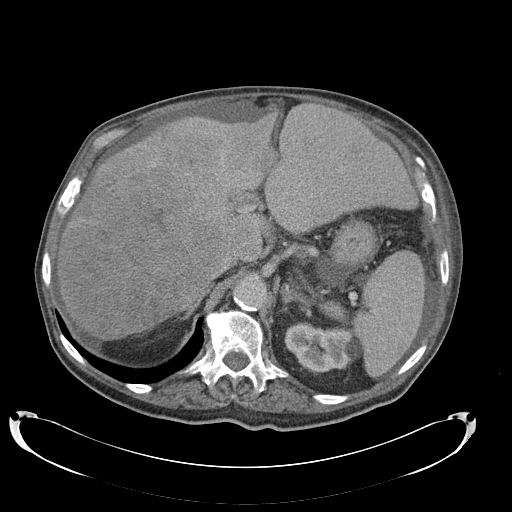
[im 33/58  lung]
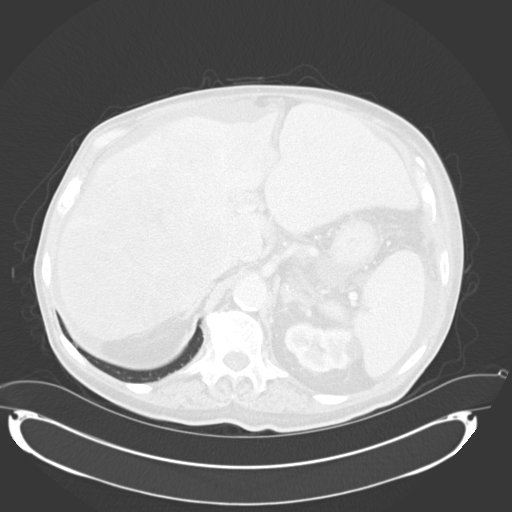
[im 41/58  soft-tissue]
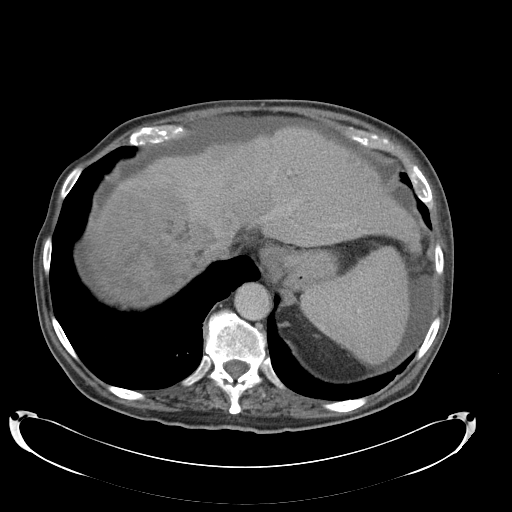
[im 41/58  lung]
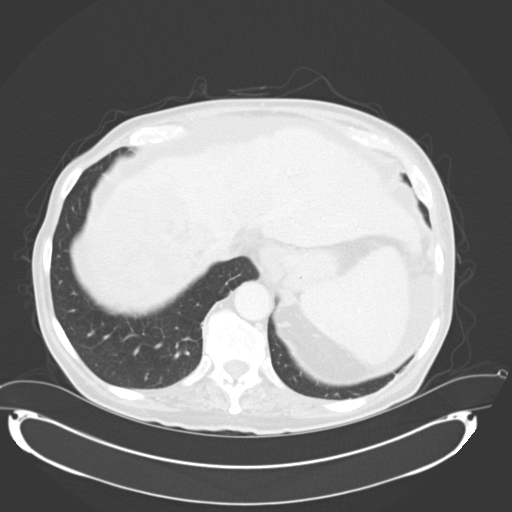
[im 49/58  soft-tissue]
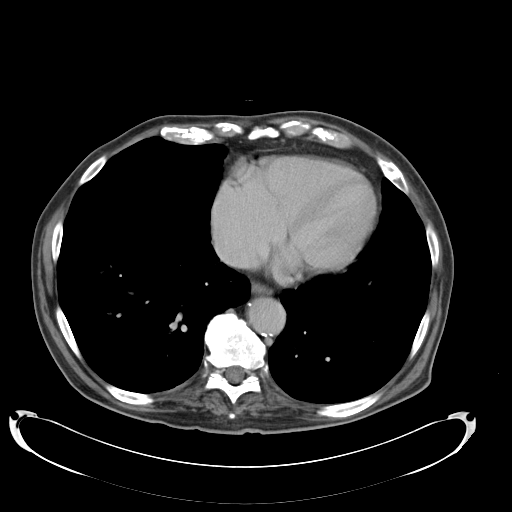
[im 49/58  lung]
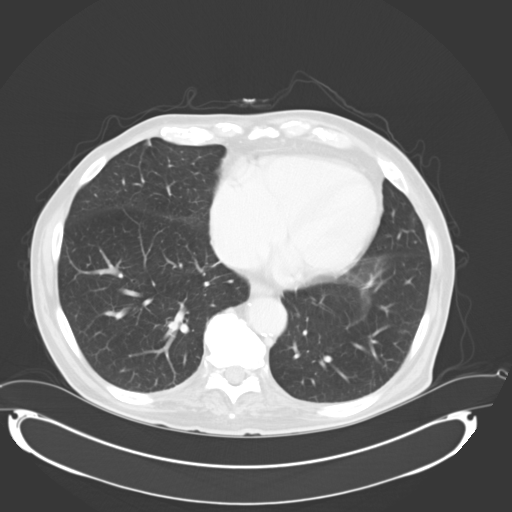

[Series 602: <mpr thick range> · coronal · 0.85mm/px · 1 of 149 slices shown, 2 images]
[im 75/149  soft-tissue]
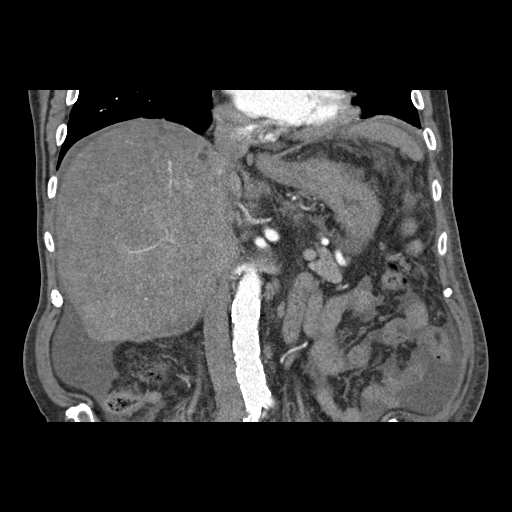
[im 75/149  bone]
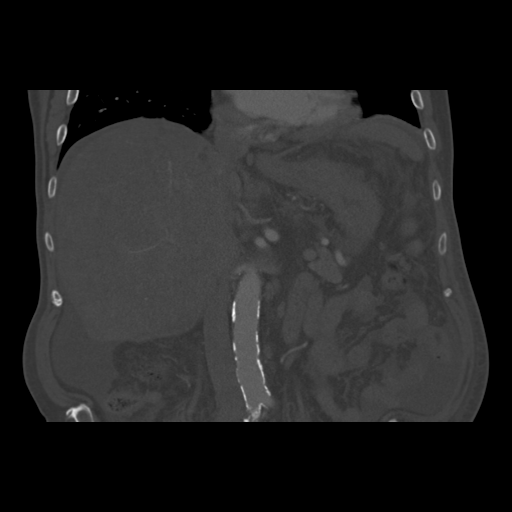

[9 of 46 positions shown; findings below may reference images not displayed]

FINDINGS: Vascular Findings:

Abdominal aorta: There is a moderate amount of mixed calcified and
noncalcified atherosclerotic plaque within a normal caliber thoracic
aorta. No abdominal aortic aneurysm or dissection. No periaortic
stranding.

Celiac artery: There is a very minimal amount of mixed calcified and
noncalcified atherosclerotic plaque involving the origin and
proximal aspect of the celiac artery, not resulting in a
hemodynamically significant stenosis. Conventional branching
pattern, though note, the left hepatic artery is noted to arise from
the proper hepatic artery opposite the takeoff of the GDA. The right
gastric artery is not definitely identified.

Right inferior phrenic: The right inferior phrenic artery arises
from the left side of the abdominal aorta immediately cranial to the
takeoff of the celiac artery (coronal image 88, series 602 with at
least partial supply to the mass involving the dome of the right
lobe of the liver (representative axial image 24, series 5).

Portal vein: There is complete occlusion of the right portal vein
with enhancing nonocclusive malignant thrombus extending into the
distal aspects of the main portal vein (axial image 29, series 7)
and into the central aspect of the left portal vein (image 23,
series 7), grossly unchanged. The splenic vein and SMV remain
patent.

SMA: There is a minimal amount of eccentric noncalcified
atherosclerotic plaque involving the proximal aspect of the SMA
(representative image 48, series 5, not definitely resulting in a
hemodynamically significant stenosis. Conventional branching
pattern.

Right Renal artery: Solitary ; there is a mixture of calcified and
noncalcified atherosclerotic plaque involving the origin of the
right renal artery, not resulting in hemodynamically significant
stenosis.

Left Renal artery: Solitary; there is a minimal amount of eccentric
primarily calcified atherosclerotic plaque involving the caudal
aspect of the origin of the left renal artery, not resulting in
hemodynamically significant stenosis.

IMA: Patent throughout its imaged course.

Review of the MIP images confirms the above findings.

--------------------------------------------------------------------------------

Nonvascular Findings:

Hepatic contour remains slightly nodular. Grossly unchanged
appearance of extensive infiltrative mass nearly replacing the
entirety of the enlarged right lobe of the liver - exact
measurements of this infiltrative mass are difficult though measure
at least 20.6 x 13.3 x 19.3 cm as measured in greatest oblique axial
(image 52, series 5) and coronal (image 61, series 602) dimensions
respectively. There has been interval development of a small to
moderate volume intra-abdominal ascites.

Symmetric enhancement of the bilateral kidneys. Note is made of
several left-sided renal cysts. Right-sided hypo attenuating renal
lesions are too small to adequately characterize of favored to
represent additional renal cysts. No definite renal stones on this
postcontrast examination. Normal appearance of the bilateral adrenal
glands and pancreas. The spleen is borderline enlarged measuring 16
cm in length.

Scattered colonic diverticulosis without evidence of diverticulitis.
The imaged loops of bowel are otherwise normal in course and
caliber. A small duodenal diverticulum is suspected. No
pneumoperitoneum, pneumatosis or portal venous gas.

Retroperitoneal adenopathy is demonstrated within the imaged upper
abdomen with index left-sided periaortic lymph node measuring 1.0 cm
in greatest short axis diameter (image 59, series 5). Imaged loops
of bowel are normal in course and caliber.

Limited visualization of the lower thorax demonstrates a punctate
(approximately 2 mm) noncalcified subpleural nodule within image
caudal aspect of the lingula (image 8, series 10). There is minimal
subsegmental atelectasis within the left costophrenic angle. No
discrete focal airspace opacities. No pleural effusion.

Normal heart size. Coronary artery calcifications. No pericardial
effusion.

No acute or aggressive osseus abnormalities. Stigmata of DISH within
the imaged caudal aspect of the thoracic as well as the superior
aspect of the lumbar spine. Moderate to severe multilevel DDD
throughout the lumbar spine. Regional soft tissues appear normal.
IMPRESSION: 1. Conventional hepatic arterial supply as above.
2. Grossly unchanged infiltrative mass nearly replacing the entirety
of the enlarged right lobe of the liver, measuring at least 20.6 cm
in greatest diameter, with associated retroperitoneal adenopathy.
3. Interval development of small to moderate volume intra-abdominal
ascites.
4. Grossly unchanged occlusive thrombosis of the right portal vein
with enhancing malignant thrombus involving the distal aspect of the
main portal and peripheral aspect of the left portal veins.
5. Indeterminate punctate (approximately 2 mm) noncalcified
subpleural nodule within the lingula. Continued attention on
follow-up is recommended.
